# Patient Record
Sex: Female | Born: 1950 | Race: White | Hispanic: No | Marital: Married | Smoking: Never smoker
Health system: Southern US, Community
[De-identification: ages and names within clinical notes are randomized; demographics above are authoritative.]

## PROBLEM LIST (undated history)

## (undated) DIAGNOSIS — E785 Hyperlipidemia, unspecified: Secondary | ICD-10-CM

## (undated) DIAGNOSIS — R519 Headache, unspecified: Secondary | ICD-10-CM

## (undated) DIAGNOSIS — R51 Headache: Secondary | ICD-10-CM

## (undated) DIAGNOSIS — I2510 Atherosclerotic heart disease of native coronary artery without angina pectoris: Secondary | ICD-10-CM

## (undated) DIAGNOSIS — G8929 Other chronic pain: Secondary | ICD-10-CM

## (undated) DIAGNOSIS — R001 Bradycardia, unspecified: Secondary | ICD-10-CM

## (undated) DIAGNOSIS — I10 Essential (primary) hypertension: Secondary | ICD-10-CM

## (undated) HISTORY — DX: Other chronic pain: G89.29

## (undated) HISTORY — PX: CORONARY STENT PLACEMENT: SHX1402

## (undated) HISTORY — DX: Headache: R51

## (undated) HISTORY — PX: OTHER SURGICAL HISTORY: SHX169

## (undated) HISTORY — DX: Headache, unspecified: R51.9

## (undated) HISTORY — DX: Bradycardia, unspecified: R00.1

## (undated) HISTORY — DX: Atherosclerotic heart disease of native coronary artery without angina pectoris: I25.10

## (undated) HISTORY — PX: CARDIAC CATHETERIZATION: SHX172

## (undated) HISTORY — DX: Hyperlipidemia, unspecified: E78.5

## (undated) HISTORY — DX: Essential (primary) hypertension: I10

---

## 1998-06-15 ENCOUNTER — Other Ambulatory Visit: Admission: RE | Admit: 1998-06-15 | Discharge: 1998-06-15 | Payer: Self-pay | Admitting: *Deleted

## 1999-06-28 ENCOUNTER — Encounter: Payer: Self-pay | Admitting: *Deleted

## 1999-06-28 ENCOUNTER — Other Ambulatory Visit: Admission: RE | Admit: 1999-06-28 | Discharge: 1999-06-28 | Payer: Self-pay | Admitting: *Deleted

## 1999-06-28 ENCOUNTER — Encounter: Admission: RE | Admit: 1999-06-28 | Discharge: 1999-06-28 | Payer: Self-pay | Admitting: *Deleted

## 2001-05-05 ENCOUNTER — Encounter: Payer: Self-pay | Admitting: *Deleted

## 2001-05-05 ENCOUNTER — Encounter: Admission: RE | Admit: 2001-05-05 | Discharge: 2001-05-05 | Payer: Self-pay | Admitting: *Deleted

## 2001-05-05 ENCOUNTER — Other Ambulatory Visit: Admission: RE | Admit: 2001-05-05 | Discharge: 2001-05-05 | Payer: Self-pay | Admitting: *Deleted

## 2002-06-21 ENCOUNTER — Encounter: Payer: Self-pay | Admitting: *Deleted

## 2002-06-21 ENCOUNTER — Encounter: Admission: RE | Admit: 2002-06-21 | Discharge: 2002-06-21 | Payer: Self-pay | Admitting: *Deleted

## 2002-07-20 ENCOUNTER — Other Ambulatory Visit: Admission: RE | Admit: 2002-07-20 | Discharge: 2002-07-20 | Payer: Self-pay | Admitting: *Deleted

## 2003-07-05 ENCOUNTER — Encounter: Admission: RE | Admit: 2003-07-05 | Discharge: 2003-07-05 | Payer: Self-pay | Admitting: *Deleted

## 2003-07-12 ENCOUNTER — Other Ambulatory Visit: Admission: RE | Admit: 2003-07-12 | Discharge: 2003-07-12 | Payer: Self-pay | Admitting: *Deleted

## 2003-12-01 ENCOUNTER — Other Ambulatory Visit: Admission: RE | Admit: 2003-12-01 | Discharge: 2003-12-01 | Payer: Self-pay | Admitting: *Deleted

## 2004-07-11 ENCOUNTER — Other Ambulatory Visit: Admission: RE | Admit: 2004-07-11 | Discharge: 2004-07-11 | Payer: Self-pay | Admitting: *Deleted

## 2004-07-19 ENCOUNTER — Encounter: Admission: RE | Admit: 2004-07-19 | Discharge: 2004-07-19 | Payer: Self-pay | Admitting: *Deleted

## 2005-09-17 ENCOUNTER — Encounter: Admission: RE | Admit: 2005-09-17 | Discharge: 2005-09-17 | Payer: Self-pay | Admitting: *Deleted

## 2005-09-25 ENCOUNTER — Other Ambulatory Visit: Admission: RE | Admit: 2005-09-25 | Discharge: 2005-09-25 | Payer: Self-pay | Admitting: *Deleted

## 2005-12-26 ENCOUNTER — Encounter: Admission: RE | Admit: 2005-12-26 | Discharge: 2005-12-26 | Payer: Self-pay | Admitting: Unknown Physician Specialty

## 2006-09-23 ENCOUNTER — Encounter: Admission: RE | Admit: 2006-09-23 | Discharge: 2006-09-23 | Payer: Self-pay | Admitting: *Deleted

## 2006-10-07 ENCOUNTER — Other Ambulatory Visit: Admission: RE | Admit: 2006-10-07 | Discharge: 2006-10-07 | Payer: Self-pay | Admitting: *Deleted

## 2007-04-15 ENCOUNTER — Encounter: Payer: Self-pay | Admitting: Cardiology

## 2007-04-30 ENCOUNTER — Ambulatory Visit: Payer: Self-pay | Admitting: Cardiology

## 2007-05-01 ENCOUNTER — Encounter: Payer: Self-pay | Admitting: Cardiology

## 2007-05-05 ENCOUNTER — Inpatient Hospital Stay (HOSPITAL_BASED_OUTPATIENT_CLINIC_OR_DEPARTMENT_OTHER): Admission: RE | Admit: 2007-05-05 | Discharge: 2007-05-05 | Payer: Self-pay | Admitting: Cardiovascular Disease

## 2007-05-05 ENCOUNTER — Ambulatory Visit: Payer: Self-pay | Admitting: Cardiovascular Disease

## 2007-05-05 ENCOUNTER — Inpatient Hospital Stay (HOSPITAL_COMMUNITY): Admission: RE | Admit: 2007-05-05 | Discharge: 2007-05-06 | Payer: Self-pay | Admitting: Cardiology

## 2007-05-21 ENCOUNTER — Ambulatory Visit: Payer: Self-pay | Admitting: Cardiology

## 2007-08-10 ENCOUNTER — Ambulatory Visit: Payer: Self-pay | Admitting: Cardiology

## 2007-09-21 ENCOUNTER — Inpatient Hospital Stay (HOSPITAL_COMMUNITY): Admission: RE | Admit: 2007-09-21 | Discharge: 2007-09-25 | Payer: Self-pay | Admitting: Orthopedic Surgery

## 2008-01-12 ENCOUNTER — Encounter: Admission: RE | Admit: 2008-01-12 | Discharge: 2008-01-12 | Payer: Self-pay | Admitting: Family Medicine

## 2008-07-22 ENCOUNTER — Encounter: Payer: Self-pay | Admitting: Cardiology

## 2008-08-01 ENCOUNTER — Ambulatory Visit: Payer: Self-pay | Admitting: Cardiology

## 2008-08-01 ENCOUNTER — Encounter: Payer: Self-pay | Admitting: Physician Assistant

## 2009-02-02 ENCOUNTER — Encounter: Admission: RE | Admit: 2009-02-02 | Discharge: 2009-02-02 | Payer: Self-pay | Admitting: Family Medicine

## 2009-02-02 IMAGING — CR DG CHEST 1V PORT
1 series · 1 of 1 positions shown · non-contrast
Comparison: None

CLINICAL DATA: Postop knee replacement. Temperature.

CHEST - 1 VIEW

[view not recorded]
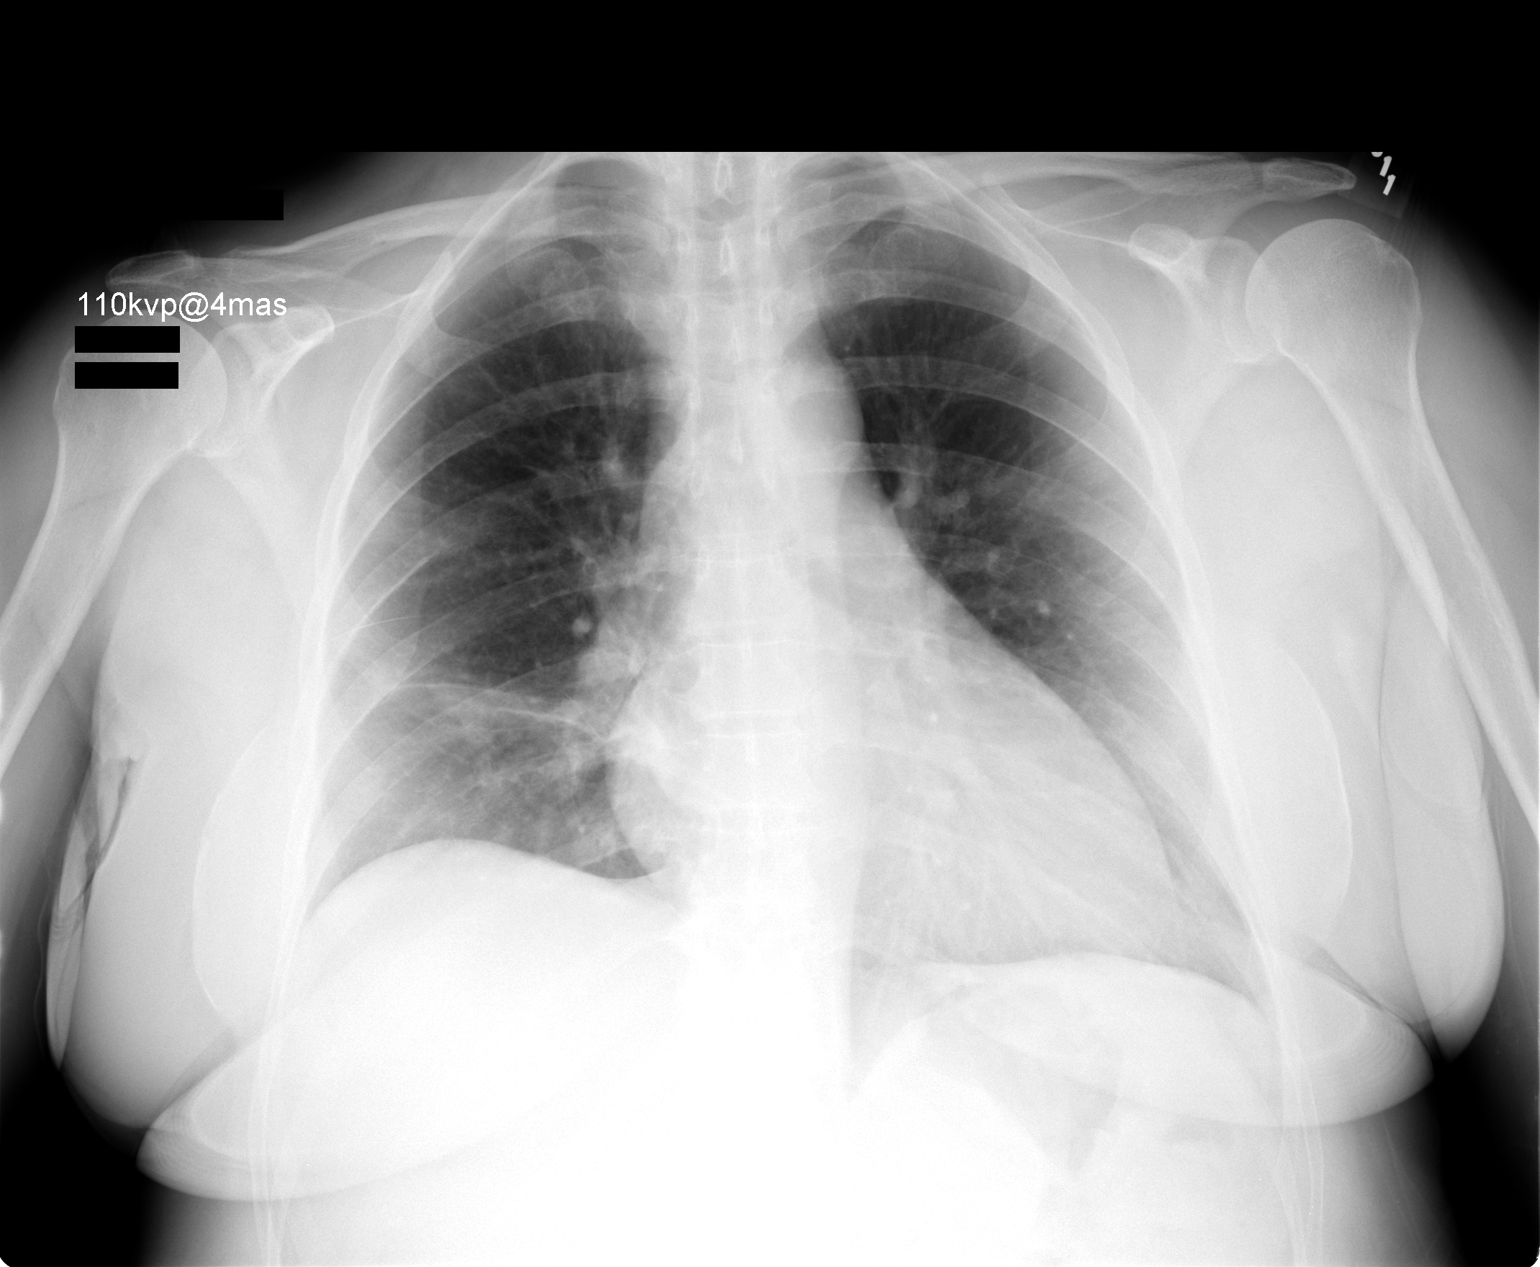

[1 of 1 positions shown; findings below may reference images not displayed]

FINDINGS: Midline trachea. Normal heart size for level of inspiration. No
pleural effusion or pneumothorax. Mild right hemidiaphragm elevation. Minimal
linear opacity in the right lower lobe. Left lung clear.

IMPRESSION

1. Volume loss and linear opacity right lung base. Most likely atelectasis. If
symptoms persist, followup films should be considered to exclude early
pneumonia, felt less likely.

## 2009-10-02 ENCOUNTER — Encounter: Payer: Self-pay | Admitting: Cardiology

## 2009-10-06 ENCOUNTER — Encounter: Payer: Self-pay | Admitting: Cardiology

## 2009-10-24 DIAGNOSIS — E785 Hyperlipidemia, unspecified: Secondary | ICD-10-CM

## 2009-10-24 DIAGNOSIS — E7800 Pure hypercholesterolemia, unspecified: Secondary | ICD-10-CM

## 2009-10-24 DIAGNOSIS — R5381 Other malaise: Secondary | ICD-10-CM

## 2009-10-24 DIAGNOSIS — R072 Precordial pain: Secondary | ICD-10-CM | POA: Insufficient documentation

## 2009-10-24 DIAGNOSIS — R5383 Other fatigue: Secondary | ICD-10-CM

## 2009-10-25 ENCOUNTER — Ambulatory Visit: Payer: Self-pay | Admitting: Cardiology

## 2009-10-25 DIAGNOSIS — I2510 Atherosclerotic heart disease of native coronary artery without angina pectoris: Secondary | ICD-10-CM | POA: Insufficient documentation

## 2009-10-25 DIAGNOSIS — I259 Chronic ischemic heart disease, unspecified: Secondary | ICD-10-CM | POA: Insufficient documentation

## 2009-11-14 ENCOUNTER — Encounter: Payer: Self-pay | Admitting: Cardiology

## 2010-02-21 ENCOUNTER — Encounter: Admission: RE | Admit: 2010-02-21 | Discharge: 2010-02-21 | Payer: Self-pay | Admitting: Family Medicine

## 2010-02-28 ENCOUNTER — Encounter: Payer: Self-pay | Admitting: Cardiology

## 2010-03-05 ENCOUNTER — Encounter: Payer: Self-pay | Admitting: Cardiology

## 2010-09-10 ENCOUNTER — Encounter: Payer: Self-pay | Admitting: Cardiology

## 2010-09-13 NOTE — Letter (Signed)
Summary: External Correspondence/ OFFICE NOTE DR. DANIEL  External Correspondence/ OFFICE NOTE DR. DANIEL   Imported By: Dorise Hiss 03/09/2010 11:01:06  _____________________________________________________________________  External Attachment:    Type:   Image     Comment:   External Document

## 2010-09-13 NOTE — Assessment & Plan Note (Signed)
Summary: 1 YR FU PER DEC REMINDER-SRS   Visit Type:  Follow-up Primary Provider:  Dr. Donzetta Sprung  CC:  follow-up visit.  History of Present Illness: the patient is a [age]-year-old female with history of single vessel coronary artery disease status post bare-metal stenting in 2008. The patient has normal LV function. She had a followup pharmacological stress test in December of 2008 for preoperative clearance. She status post bilateral knee replacement.  The patient reports no angina pectoris, shortness of breath orthopnea PND. She reports no palpitations or syncope. Reportedly, the patient had lipid panel done, but she was unable to provide me with the results. She will try to get them from her husband and forward them to me  Clinical Review Panels:  Cardiac Imaging Cardiac Cath Findings  CARDIAC CATHETERIZATION      ANGIOGRAPHIC DATA:  Prior to the intervention, the LAD demonstrates a   high-grade stenosis that is short, focal, and discrete, just prior to   the second LAD diagonal bifurcation.  Following deployment of a short   non-drug-eluting platform, the stenosis was 80%-90%, and following   dilatation, this was reduced to 0% residual luminal narrowing.  There   was no evidence of edge tear. There was slight pinching of the tiny   first diagonal, but this was not significant.  Flow into the second   diagonal and bifurcation was excellent.      CONCLUSION:  Successful percutaneous stenting of the left anterior   descending artery, as described above.      DISPOSITION:  The patient be treated with aspirin and Plavix.  She will   follow up with Dr. Andee Lineman.               Arturo Morton. Riley Kill, MD, [facility]   Electronically Signed     (05/05/2007)    Preventive Screening-Counseling & Management  Alcohol-Tobacco     Smoking Status: never  Current Medications (verified): 1)  Aspirin 81 Mg Tbec (Aspirin) .... Take One Tablet By Mouth Daily 2)  Simvastatin 40 Mg Tabs  (Simvastatin) .... Take One Tablet By Mouth Daily At Bedtime 3)  Pantoprazole Sodium 40 Mg Tbec (Pantoprazole Sodium) .... Take 1 Tablet By Mouth Once A Day 4)  Minocycline Hcl 100 Mg Tabs (Minocycline Hcl) .... Take 1 Tablet By Mouth Once A Day 5)  Trazodone Hcl 100 Mg Tabs (Trazodone Hcl) .... Take 1 Tablet By Mouth Once A Day 6)  Atenolol 50 Mg Tabs (Atenolol) .... Take One Tablet By Mouth Daily  Allergies (verified): No Known Drug Allergies  Comments:  Nurse/Medical Assistant: The patient's medications were reviewed with the patient and were updated in the Medication List. Pt brought medication bottles to office visit.  Cyril Loosen, RN, BSN (October 25, 2009 9:08 AM)  Past History:  Past Medical History: Reviewed history from 10/24/2009 and no changes required. 1. Single vessel CAD.     a.     Status post bare metal stenting of LAD, September 2008.     b.     Normal LVF.     c.     Normal adenosine Cardiolite; EF 60%, December 2008. 2. Asymptomatic sinus bradycardia. 3. Borderline hypertension. 4. Dyslipidemia. 5. Chronic headaches.   Past Surgical History: cardiac catherization Arthroscopy, abrasion arthroplasty of medical remoral condyle   1994 Status post bilateral knee replacements  Family History: Reviewed history and no changes required. noncontributory  Social History: Reviewed history from 10/24/2009 and no changes required. Married  Tobacco Use - No.  Smoking Status:  never  Review of Systems  The patient denies fatigue, malaise, fever, weight gain/loss, vision loss, decreased hearing, hoarseness, chest pain, palpitations, shortness of breath, prolonged cough, wheezing, sleep apnea, coughing up blood, abdominal pain, blood in stool, nausea, vomiting, diarrhea, heartburn, incontinence, blood in urine, muscle weakness, joint pain, leg swelling, rash, skin lesions, headache, fainting, dizziness, depression, anxiety, enlarged lymph nodes, easy bruising or  bleeding, and environmental allergies.    Vital Signs:  Patient profile:   60 year old female Height:      60 inches Weight:      145.50 pounds BMI:     28.52 Pulse rate:   52 / minute BP sitting:   114 / 77  (left arm) Cuff size:   regular  Vitals Entered By: Cyril Loosen, RN, BSN (October 25, 2009 9:04 AM)  Nutrition Counseling: Patient's BMI is greater than 25 and therefore counseled on weight management options. CC: follow-up visit Comments Follow up visit. No cardiac complaints   Physical Exam  Additional Exam:  General: Well-developed, well-nourished in no distress head: Normocephalic and atraumatic eyes PERRLA/EOMI intact, conjunctiva and lids normal nose: No deformity or lesions mouth normal dentition, normal posterior pharynx neck: Supple, no JVD.  No masses, thyromegaly or abnormal cervical nodes lungs: Normal breath sounds bilaterally without wheezing.  Normal percussion heart: regular rate and rhythm with normal S1 and S2, no S3 or S4.  PMI is normal.  No pathological murmurs abdomen: Normal bowel sounds, abdomen is soft and nontender without masses, organomegaly or hernias noted.  No hepatosplenomegaly musculoskeletal: Back normal, normal gait muscle strength and tone normal pulsus: Pulse is normal in all 4 extremities Extremities: No peripheral pitting edema neurologic: Alert and oriented x 3 skin: Intact without lesions or rashes cervical nodes: No significant adenopathy psychologic: Normal affect    Impression & Recommendations:  Problem # 1:  CORONARY ARTERY DISEASE, S/P PTCA (ICD-414.9) the patient reports no recurrent chest pain. We will continue her current medical regimen. No indication for stress testing. The following medications were removed from the medication list:    Isoptin Sr 120 Mg Cr-tabs (Verapamil hcl) .Marland Kitchen... 1 tablet by mouth once a day Her updated medication list for this problem includes:    Aspirin 81 Mg Tbec (Aspirin) .Marland Kitchen... Take one  tablet by mouth daily    Atenolol 50 Mg Tabs (Atenolol) .Marland Kitchen... Take one tablet by mouth daily  Problem # 2:  DYSLIPIDEMIA (ICD-272.4) reportedly the patient's LDL is not within goal. She'll forward the results to me. Consideration should be given to increase simvastatin to maintain a goal LDL of less than 100 mg percent. Her updated medication list for this problem includes:    Simvastatin 40 Mg Tabs (Simvastatin) .Marland Kitchen... Take one tablet by mouth daily at bedtime  Other Orders: EKG w/ Interpretation (93000)  Patient Instructions: 1)  Your physician recommends that you continue on your current medications as directed. Please refer to the Current Medication list given to you today. 2)  Follow up in  1 year

## 2010-09-13 NOTE — Cardiovascular Report (Signed)
Summary: Cardiac Cath Other  Cardiac Cath Other   Imported By: Zachary George 10/24/2009 19:07:39  _____________________________________________________________________  External Attachment:    Type:   Image     Comment:   External Document

## 2010-09-13 NOTE — Miscellaneous (Signed)
Summary: rx - simvastatin  Clinical Lists Changes  Medications: Changed medication from SIMVASTATIN 40 MG TABS (SIMVASTATIN) Take one tablet by mouth daily at bedtime to SIMVASTATIN 80 MG TABS (SIMVASTATIN) Take 1 tab by mouth at bedtime - Signed Rx of SIMVASTATIN 80 MG TABS (SIMVASTATIN) Take 1 tab by mouth at bedtime;  #30 x 6;  Signed;  Entered by: Hoover Brunette, LPN;  Authorized by: Lewayne Bunting, MD, [facility];  Method used: Faxed to [facility], [address], [place], [place], [place]  [address], Ph: [phone], Fax: [phone]    Prescriptions: SIMVASTATIN 80 MG TABS (SIMVASTATIN) Take 1 tab by mouth at bedtime  #30 x 6   Entered by:   Hoover Brunette, LPN   Authorized by:   Lewayne Bunting, MD, [facility]   Signed by:   Hoover Brunette, LPN on 78/46/9629   Method used:   Faxed to ...       [facility] (retail)       [address]       [place], [place]  [address]       Ph: [phone]       Fax: [phone]   RxID:   [id number]

## 2010-09-13 NOTE — Letter (Signed)
Summary: External Correspondence/ OFFICE NOTE [facility]  External Correspondence/ OFFICE NOTE [facility]   Imported By: Dorise Hiss 10/16/2009 15:53:48  _____________________________________________________________________  External Attachment:    Type:   Image     Comment:   External Document

## 2010-12-05 ENCOUNTER — Ambulatory Visit: Payer: Self-pay | Admitting: Cardiology

## 2010-12-25 NOTE — Assessment & Plan Note (Signed)
[facility]                          [facility] OFFICE NOTE   DORAINE, SCHEXNIDER                   MRN:          [id number]  DATE:05/21/2007                            DOB:          1951/04/24    HISTORY OF PRESENT ILLNESS:  The patient is a 60 year old female, wife  of Dr. Marry Guan here in [place].  The patient had recent symptoms of  substernal chest pain and was referred for catheterization, and was  found to have a focal LAD lesion which was stented with a non-drug-  eluting Platform.  The procedure was performed by Dr. Riley Kill.  The  patient has done quite well since.  She reports no groin complications.  She has no recurrent chest pain or shortness of breath.  She continues  to have significant disability because of her bilateral knee problems.   MEDICATIONS:  1. Glucosamine.  2. Minocycline.  3. Atenolol 50 mg p.o. daily.  4. Tylenol P.M.  5. Calcium.  6. Folic acid.  7. B12.  8. Coenzyme-Q10.  9. Vitamin C.  10.Fish oil.  11.Multivitamin.  12.Plavix 75 mg p.o. daily.   PHYSICAL EXAMINATION:  VITAL SIGNS:  Blood pressure 123/76, heart rate  60, weight is 142 pounds.  NECK:  Normal carotid upstrokes.  No carotid bruits seen.  LUNGS:  Clear breath sounds bilaterally.  HEART:  Regular rate and rhythm.  Normal S1 S2.  No murmurs, rubs, or  gallops.  ABDOMEN:  Soft nontender.  No rebound or guarding.  Good bowel sounds.  EXTREMITIES:  No cyanosis, clubbing, or edema.  NEUROLOGIC:  The patient is alert, oriented, and grossly nonfocal.   PROBLEM LIST:  1. Status post stent placement to the left anterior descending artery,      single-vessel coronary artery disease.  2. Normal left ventricular function.  3. History of hypertension, stable.  4. Family history of coronary artery disease.  5. Bilateral knee arthropathy.   PLAN:  1. The patient will have LFTs and lipids done in 6-8 weeks.  2. She will start Zocor 20 mg p.o.  q.h.s.  3. I cleared the patient for steroid injection.  4. I also told the patient that she needs to alternate Celebrex with      Tylenol after Plavix has been discontinued.  5. The Plavix can be discontinued in one month.  6. We will plan on an adenosine Cardiolite study in 3 months and clear      the patient for knee surgery.     Learta Codding, MD,FACC  Electronically Signed    GED/MedQ  DD: 05/21/2007  DT: 05/21/2007  Job #: [id number]   cc:   Almedia Balls. [name], M.D.

## 2010-12-25 NOTE — Cardiovascular Report (Signed)
NAMEBRUCE, MAYERS            ACCOUNT NO.:  [id number]   MEDICAL RECORD NO.:  [id number]          PATIENT TYPE:  INP   LOCATION:  6524                         FACILITY:  [facility]   PHYSICIAN:  Arturo Morton. Riley Kill, MD, FACCDATE OF BIRTH:  Sep 14, 1950   DATE OF PROCEDURE:  DATE OF DISCHARGE:                            CARDIAC CATHETERIZATION   INDICATIONS:  Ms. Fettes is a 59 year old female who presents with some  with exertional chest tightness.  She has been moderately active, and  this has clearly been brought on by activity.  She underwent diagnostic  catheterization by Dr. Eden Emms.  This demonstrated a high-grade stenosis  just after the small first diagonal, and prior to the large second  diagonal.  It is a fairly short discrete lesion.  Unfortunately, the  patient has required nonsteroidal anti-inflammatory drugs, and also is  in need of knee surgery.  Based on these various discussions, we  discussed the options of a drug-eluting versus non-drug-eluting  platform, and all their risks and benefits.  This was thoroughly  discussed with the patient and her husband, who is a physician, prior to  the procedure.  The patient received 600 mg of oral clopidogrel in the  day catheterization area and was brought to the catheterization  laboratory for percutaneous intervention.   PROCEDURE:  Percutaneous stenting of the left anterior descending artery  using a non-drug-eluting platform.   DESCRIPTION OF THE PROCEDURE:  The patient was brought to the  catheterization lab and prepped and draped in usual fashion.  Using a  double-glove technique, we exchanged the existing 4-French sheath for a  6-French sheath.  Subcutaneous Xylocaine was administered.  Following  this, the patient was given bivalirudin according to protocol, and ACT  was checked and found to be appropriate for percutaneous intervention.  A JL-3.5 guiding catheter was utilized and found to be appropriate for  this vessel.   A Prowater wire was then passed down the artery but only  would pass into the diagonal.  We then tried a traverse wire to try to  get into the LAD proper, and this also did not do well into the LAD.  I  therefore fashioned another Prowater wire with a steeper tip and was  able to pass it distally into the LAD.  Predilatation was then done with  a 2.25 x 9 Maverick balloon.  This was followed by deployment of a 2.75  x 12 non-drug-eluting platform Abbott Vision stent.  The stent was  carefully placed and then deployed at 14 atmospheres.  Postdilatation  was then carried out with a 3.25 short Quantum Maverick balloon  throughout the stent.  Only the very distal edges of the stent distally  were not aggressively postdilated, in large part because the distal end  of the stent was at the bifurcation.  Overall, the patient tolerated  procedure well.  All catheters were subsequently removed.  The femoral  sheath was sewn into place, and she was taken to the holding area in  satisfactory clinical condition.  I then reviewed the angiographic  studies with her husband.   ANGIOGRAPHIC DATA:  Prior  to the intervention, the LAD demonstrates a  high-grade stenosis that is short, focal, and discrete, just prior to  the second LAD diagonal bifurcation.  Following deployment of a short  non-drug-eluting platform, the stenosis was 80%-90%, and following  dilatation, this was reduced to 0% residual luminal narrowing.  There  was no evidence of edge tear. There was slight pinching of the tiny  first diagonal, but this was not significant.  Flow into the second  diagonal and bifurcation was excellent.   CONCLUSION:  Successful percutaneous stenting of the left anterior  descending artery, as described above.   DISPOSITION:  The patient be treated with aspirin and Plavix.  She will  follow up with Dr. Andee Lineman.      Arturo Morton. Riley Kill, MD, [facility]  Electronically Signed     TDS/MEDQ  D:  05/05/2007  T:   05/06/2007  Job:  [id number]   cc:   Raechel Ache Eden Emms, MD, [facility]  Ollen Gross, M.D.  Learta Codding, MD,FACC

## 2010-12-25 NOTE — Cardiovascular Report (Signed)
NAMECOLLENE, MASSIMINO            ACCOUNT NO.:  [id number]   MEDICAL RECORD NO.:  [id number]          PATIENT TYPE:  OIB   LOCATION:  NA                           FACILITY:  [facility]   PHYSICIAN:  Peter C. Eden Emms, MD, FACCDATE OF BIRTH:  October 01, 1950   DATE OF PROCEDURE:  DATE OF DISCHARGE:                            CARDIAC CATHETERIZATION   PROCEDURE:  Coronary arteriography.   INDICATIONS:  60 year old patient with new onset chest pain.   Cine catheterization was done, 4-French catheters from right femoral  artery.   Left main coronary artery was normal.   The mid-LAD had a 95% hazy lesion just before the takeoff of a large  second diagonal branch.  The distal LAD was somewhat slow to fill.  Circumflex coronary artery was nondominant and normal.   The right coronary artery was dominant and normal.   RAO VENTRICULOGRAPHY:  RAO ventriculography was normal.  EF was 60%.  There was no gradient across the aortic valve and no MR.  LV pressure  was 143/7, aortic pressure was 140/80.   IMPRESSION:  The films will be reviewed with Dr. Riley Kill.  We will  transfer the patient to the inpatient lab.  She will subsequently have  angioplasty and stenting of the mid-LAD today.   The patient does eventually need bilateral knee surgery.  She also has a  trip planned to [place] in 2-3 weeks.  I will leave it up to Dr. Riley Kill  and the patient to decide whether to place a drug-eluting or non-drug-  eluting stent.  The LAD is probably a 3-5 vessel and could probably  accommodate either one.  She is a nondiabetic.      Noralyn Pick. Eden Emms, MD, [facility]  Electronically Signed     PCN/MEDQ  D:  05/05/2007  T:  05/05/2007  Job:  [id number]   cc:   Learta Codding, MD,FACC

## 2010-12-25 NOTE — Assessment & Plan Note (Signed)
[facility]                          [facility] OFFICE NOTE   REDITH, DRACH                   MRN:          [id number]  DATE:04/30/2007                            DOB:          06-04-1951    PRIMARY CARE PHYSICIAN:  Dr. [name].   REASON FOR CONSULTATION:  Self referral for observation of new onset  substernal chest pain.   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 Mora Appl [place].  I  received a call yesterday from Dr. Mora Appl to evaluate his wife because  of new onset substernal chest pain.  The patient reports to me that she  had new onset substernal chest pain, which was described as a heaviness  for the last 3 weeks.  There is associated diaphoresis and sweatiness,  as well as radiation to both arms with a sensation of heaviness and  numbness in both arms.  The patient exercises an hour daily on a  recumbent stationary bike.  She has noticed onset of substernal chest  pain on the bike initially.  The patient states that episodes last a few  minutes, and then resolves when she discontinues her stationary bike.  The patient denies, however, any associated shortness of breath.  She  feels these symptoms have somewhat increased in frequency.  She also had  an episode a couple of days ago where she developed substernal chest  pain at rest, and this was very brief.  The patient then tried to get on  her exercise bike, and fairly quickly her symptoms then worsened.  She  then stopped the exercise bike and felt better.  Interestingly, the  patient's symptoms occur mainly in the morning, but this is the time  when she does her exercises.   In the office today, the patient reports no chest pain at rest, and her  EKG is essentially within normal limits.   ALLERGIES:  NO KNOWN DRUG ALLERGIES.   MEDICATIONS:  The patient, unfortunately, does not know the doses of  her  medications, but she will call back to [place] with dosing.  Glucosamine  chondroitin, minocycline, atenolol daily, voltaren, Tylenol PM, calcium,  folic acid, B12, coenzyme Q10, vitamin C, fish oil, multivitamin.   PAST MEDICAL HISTORY:  1. History of hypertension, on atenolol.  2. History of tonsillectomy and tubal ligation.  3. Arthroscopic knee surgery on chronic nonsteroidal treatment with      voltaren.   FAMILY HISTORY:  Father died of a heart attack at age 42.  He also had  several brothers that the patient recalls had heart disease.  Mother  died in a car accident.  She has 4 brothers, 1 who had a heart attack,  and 1 who has angina __________ .  She is the 2nd oldest sister.   SOCIAL HISTORY:  As outlined above, the patient is a former Licensed conveyancer.  She also runs and exercises regularly, now on the recumbent  bike due to her knee problems.  Patient rarely has an alcoholic  beverage.  She does  not smoke.  Patient is the wife of Dr. Mora Appl here  in [place].   REVIEW OF SYSTEMS:  As per HPI.  No nausea or vomiting.  No fever or  chills.  No melena or hematochezia.  No dysuria or frequency.  No  orthopnea or PND.  No palpitations or syncope.  No neurological  symptoms.   PHYSICAL EXAMINATION:  VITAL SIGNS:  Blood pressure 121/80.  Heart rate  is 58 beats per minute.  Weight is 139 pounds.  GENERAL:  Well-nourished white female in no apparent distress.  HEENT:  Normal.  NECK EXAM:  Normal carotid upstroke.  No carotid bruits.  LUNGS:  Clear breath sounds bilaterally.  HEART:  Regular rate and rhythm with normal S1 and S2.  No murmurs,  rubs, or gallops.  ABDOMEN:  Soft and nontender.  No rebound.  Good bowel sounds.  EXTREMITY EXAM:  No cyanosis, clubbing, or edema.  NEUROLOGIC:  Patient is alert, oriented, and grossly nonfocal.   PROBLEM LIST:  1. Substernal chest pain.  Rule out angina.  2. Family history of coronary artery disease.  3. Bilateral knee arthropathy  scheduled for bilateral knee      replacement.  4. History of hypertension.   PLAN:  1. The patient's symptoms are certainly concerning for angina.  I      suspect the patient has greater than 50% pretest probability of      coronary artery disease.  2. I have given the patient various options as how to best proceed      with ruling out underlying heart disease.  Given her high pretest      probability, I do not think nuclear testing and exercise treadmill      test are indicated.  I also gave her the option of cardiac CT,      although, I also do not think that this is a good option given her      high pretest probability, and performing a cardiac CT may lead to      layer testing.  3. I have discussed with the patient risks and benefits of a      diagnostic catheterization, and she has agreed to proceed.  I also      talked to her husband on the phone, and felt comfortable with my      decision process.  I asked the patient to stop her voltaren in the      meanwhile, and to take aspirin on a daily basis.  We also gave her      a prescription for p.r.n. nitroglycerin.  I told both the patient      and her husband that if she has recurrent pain at rest, she will      need to present to the emergency room immediately, and administer      nitroglycerin.  4. The patient has been scheduled at the [facility] with      Dr. Eden Emms (whom I called personally) at 10:30 next Tuesday.     Learta Codding, MD,FACC  Electronically Signed    GED/MedQ  DD: 04/30/2007  DT: 04/30/2007  Job #: [id number]   cc:   Claud Kelp R. [name], M.D.

## 2010-12-25 NOTE — Op Note (Signed)
NAMECAYLEEN, BENJAMIN            ACCOUNT NO.:  [id number]   MEDICAL RECORD NO.:  [id number]          PATIENT TYPE:  INP   LOCATION:  0005                         FACILITY:  [facility]   PHYSICIAN:  Ollen Gross, M.D.    DATE OF BIRTH:  [birth year]   DATE OF PROCEDURE:  09/21/2007  DATE OF DISCHARGE:                               OPERATIVE REPORT   PREOPERATIVE DIAGNOSIS:  Osteoarthritis, bilateral knees.   POSTOPERATIVE DIAGNOSIS:  Osteoarthritis, bilateral knees.   PROCEDURE:  Bilateral total knee arthroplasty.   SURGEON:  Ollen Gross, M.D.   ASSISTANT:  Alexzandrew L. Perkins, P.A.C.   ANESTHESIA:  Combined spinal and epidural.   ESTIMATED BLOOD LOSS:  Minimal.   DRAINS:  Autovac x1 each side.   TOURNIQUET TIME:  Right 42 minutes at 300 mmHg, left 41 minutes at 300  mmHg.   COMPLICATIONS:  None.   CONDITION:  Good to recovery.   CLINICAL NOTE:  Ms. Blubaugh is a 60 year old female with end-stage severe  arthritis, both knees, with progressively worsening pain and  dysfunction.  She has failed nonoperative management and presents now  for bilateral total knee arthroplasty.   PROCEDURE IN DETAIL:  After successful initiation of combined spinal and  epidural anesthetic, tourniquets were placed high on both thighs and  both lower extremities were prepped and draped in the usual sterile  fashion.  She stated the right was slightly more symptomatic than the  left, so we proceeded with the right side first.  The extremity was  wrapped in an Esmarch, knee flexed, and tourniquet inflated to 300 mmHg.  A midline incision was made with 10 blade through subcutaneous tissue to  the level of the extensor mechanism.  Fresh blade was used to make a  medial parapatellar arthrotomy.  Soft tissue over the proximal and  medial tibia subperiosteally elevated to the joint line with the knife  and into the semimembranosus first with a Cobb elevator.  Soft tissue  laterally is elevated with  attention being paid to avoid the patellar  tendon and tibial tubercle.  The patella was subluxed laterally, knee  flexed 90 degrees, and ACL and PCL removed.  Drill was used to create a  starting hole in the distal femur and the canal was thoroughly  irrigated.  A 5 degrees right valgus alignment guide was placed and  referencing off the posterior condyles rotations marked and the block  pinned to remove 11 mm of the distal femur.  I took 11 mm because of  preop  flexion contracture.  Sizing blocks placed, size 2.5 is most  appropriate.  Rotation is marked off the epicondylar axis.  Size 2.5  cutting block placed and the anterior, posterior and chamfer cuts were  made.   Tibia subluxed forward and the menisci were removed.  Extramedullary  tibial alignment guide is placed referencing proximally at the medial  aspect of the tibial tubercle and distally along the second metatarsal  axis and tibial crest.  Blocks pinned to remove 2 mm off the more  deficient medial side.  Tibial resection is made with an oscillating  saw.  Size 2 was the most appropriate tibial component.  Proximal tibia  was prepared with the modular drill and keel punch for a size 2.  Femoral preparation is completed with the intercondylar cut for the size  2.5.   Size 2 mobile bearing tibial trial, size 2.5 posterior stabilized  femoral trial, and a 10 mm posterior stabilized rotating platform insert  trial are placed with.  With the 10 it hyperextended a little and a  little bit of varus/valgus placed on it with the 12.5 which allowed for  full extension with excellent varus and valgus, anterior and posterior  balance throughout full range of motion.  The patella was then everted  and thickness measured to be 21 mm.  Freehand resection taken to 12 mm,  35 template is placed, lug holes were drilled, trial patella was placed  and it tracks normally.  Osteophytes were removed off the posterior  femur with the trial in  place.  All trials are removed and the cut bone  surfaces are prepared with pulsatile lavage.  Cement was mixed and once  ready for implantation a size 2 mobile bearing tibial tray, size 2.5  posterior stabilized femur and 35 patella are cemented into place.  The  patella was held with a clamp.  Trial 12.5 mm inserts placed, knee held  in full extension, all extruded cement removed.  Once the cement had  fully hardened, then the permanent 12.5 mm posterior stabilized rotating  platform insert is placed into the tibial tray.  The wound was copiously  irrigated with saline solution and the extensor mechanism closed over a  Hemovac drain with interrupted #1 PDS.  Flexion against gravity is 135  degrees.  Tourniquet is released with a total time of 42 minutes.  Subcu  is then closed with interrupted 2-0 Vicryl.  Moist sponge is placed and  then we addressed the left knee.   Left lower extremity was wrapped in Esmarch, knee flexed, tourniquet  inflated to 300 mmHg.  Midline incision is made with a medial  parapatellar arthrotomy.  Soft tissue releases are the same as they were  on the right.  Patella was everted, knee flexed 90 degrees, ACL and PCL  removed.  Drill was used to create a starting hole in the distal femur  and the canal was thoroughly irrigated.  Five degrees left valgus  alignment guides were placed, referencing off the posterior condyles,  rotations marked and the block pinned to remove 11 mm from the distal  femur.  Once again we took 11 because of a preop flexion contracture.  Sizing blocks placed, size 2.5 is the most appropriate.  Rotations  marked off the epicondylar axis.  Size 2.5 cutting block was placed and  the anterior, posterior and chamfer cuts made.   Tibia subluxed forward and menisci were removed.  The extramedullary  tibial alignment guides placed referencing proximally at the medial  aspect of tibial tubercle and distally along the second metatarsal axis   and tibial crest.  Blocks were pinned to remove 2 mm off the more  deficient medial side.  This side has a bigger defect than the other  side, so we had to cut down a tiny bit lower.  Tibial resection is made  with the oscillating saw.  Size 2 again is the most appropriate tibial  component and the proximal tibia was prepared to modular drill and keel  punch for a size 2.  Femoral preparation is completed with the  intercondylar cut for a size 2.5.   Size 2 mobile bearing tibial trial, a size 2.5 posterior stabilized  femoral trial, and a 12.5 mm posterior stabilized rotating platform  insert trial placed.  Even with the 12.5, there was a tiny bit of varus  and valgus play and anterior and posterior play.  We went to a 15 which  allowed for full extension with excellent varus and valgus, anterior and  posterior balance throughout full range of motion.  The patella was then  everted and thickness measured to be 21 mm.  Via the freehand technique  we utilized a 35 mm trial and the patella tracked normally.  The trials  then were removed and the cut bone surfaces are prepared with pulsatile  lavage.  Prior to removing the femoral trial, I had removed the  osteophytes posteriorly from the femur.  The cement was mixed and once  ready for implantation, the size 2 mobile- bearing tibial trial, size  2.5 posterior stabilized femoral trial, and 35 patella are cemented into  place.  The patella was held with the clamp.  Trial 15 mm insert was  placed and the knee held in full extension and all extruded cement  removed.  When the cement had fully hardened, then the permanent 15 mm  posterior stabilized rotating platform insert is placed into the tibial  tray.  The wound was copiously irrigated with saline solution and the  extensor mechanism closed over Hemovac drain with interrupted #1 PDS.  Subcu closed with interrupted 2-0 Vicryl and then subcuticular, and both  knees closed with running 4-0  Monocryl.  The Autovac drain had already  been hooked to suction on the right and was subsequently hooked to  suction on the left.  The incisions were cleaned and dried and Steri-  Strips and bulky sterile dressing applied.  She was placed into knee  immobilizers, awakened and transported to recovery in stable condition.      Ollen Gross, M.D.  Electronically Signed     FA/MEDQ  D:  09/21/2007  T:  09/22/2007  Job:  [id number]

## 2010-12-25 NOTE — Discharge Summary (Signed)
NAMEAZA, [name]            ACCOUNT NO.:  [id number]   MEDICAL RECORD NO.:  [id number]          Cooper TYPE:  INP   LOCATION:  6524                         FACILITY:  [facility]   PHYSICIAN:  Arturo Morton. Riley Kill, MD, FACCDATE OF BIRTH:  Aug 13, 1950   DATE OF ADMISSION:  05/05/2007  DATE OF DISCHARGE:  05/06/2007                               DISCHARGE SUMMARY   PRIMARY CARDIOLOGIST:  Dr. Andee Lineman.   PRIMARY CARE PHYSICIAN:  Almedia Balls. [name], M.D.   PROCEDURES PERFORMED:  1. Cardiac catheterization per Dr. Bonnee Quin.      a.     Left main coronary artery normal. The mid-lad has 95% hazy       lesion just before the take-off of a large second diagonal branch.       Distal LAD was somewhat slow to fill.  Circumflex coronary artery       was nondominant and normal.  Right coronary artery was dominant       and normal.  2. Status post VISION non-drug-eluting stent to the LAD per Dr.      Riley Kill, reducing 90% lesion to 0% lesion using a 2.75 x 12 VISION      and 3.25 QM post dilatation.  3. Hypertension.  4. Arthroscopic knee surgery on chronic nonsteroidal treatment with      voltaren which has now been discontinued.   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 Francee Cooper in [place] who was seen by Dr. Andee Lineman in  his office on April 30, 2007, for complaints of ongoing chest  discomfort, concerned for angina.  The Cooper spoke with Dr. Andee Lineman  thoroughly and given her high precath probability for CAD the Cooper  was referred for cardiac catheterization at [facility].  The  Cooper was seen and examined by Dr. Bonnee Quin and cardiac  catheterization was completed on May 06, 2007.  Please see Dr.  Rosalyn Charters thorough cardiac catheterization note for full details.  It  was found that the Cooper had a 95% hazy lesion just before the take-  off of a large second diagonal branch in the mid-LAD.  The Cooper  subsequently  had VISION non-drug-eluting stent placed to the mid-LAD  reducing 90% to 0%.  The Cooper was kept overnight, placed on Plavix  and recovered very well.  The Cooper did not have any arrhythmias,  bleeding, hematoma, bruit from catheterization site.  She had no further  chest discomfort.  The Cooper was seen and examined the following  morning by myself and Dr. Bonnee Quin.  The Cooper's EKG revealed sinus  bradycardia with a ventricular rate of 59 beats/min. Otherwise normal.  The Cooper had no complaints.  The right groin was evaluated again and  was found to be healthy.  The Cooper was placed on Plavix 75 mg p.o.  daily and was to continue aspirin, beta blocker, statin will be  determined per Dr. Andee Lineman on follow-up visit.   Vital signs on discharge, blood pressure 127/80, heart rate 56,  respirations 19, O2 saturation 98% on room air.  Discharge labs:  Hemoglobin 12.4, hematocrit 36.2, white blood cells  6.1, platelets 249.  Sodium 135, potassium 3.7, chloride 102, CO2 27,  BUN 17, creatinine 0.75, glucose 87.   DISCHARGE MEDICATIONS:  1. Plavix 75 mg daily (new prescription provided).  2. Aspirin 325 mg p.o. daily.  3. Nitroglycerin 0.4 mg p.r.n. sublingually chest pain.  4. Femhrt p.o. daily.  5. Atenolol 50 mg daily.  6. Glucosamine chondroitin daily.  7. Minocycline daily.  8. Calcium, folic acid, B12, Q10, vitamin C, fish oil, multivitamin      daily.   ALLERGIES:  THE Cooper HAS NO KNOWN DRUG ALLERGIES AND NO CONTRAST  MEDIA ALLERGIES.   FOLLOW-UP APPOINTMENT:  1. The Cooper will be seen by Dr. Andee Lineman on May 21, 2007, at 2:45      p.m. for continued cardiac management and discussion of need for      statin.  2. The Cooper will follow up with   DICTATION ENDS HERE.      [name]. Lyman Bishop, NP      Arturo Morton. Riley Kill, MD, [facility]  Electronically Signed    KML/MEDQ  D:  05/06/2007  T:  05/06/2007  Job:  [id number]

## 2010-12-25 NOTE — Discharge Summary (Signed)
[name], LUDWIG            ACCOUNT NO.:  [id number]   MEDICAL RECORD NO.:  [id number]          PATIENT TYPE:  INP   LOCATION:  6524                         FACILITY:  [facility]   PHYSICIAN:  Arturo Morton. Riley Kill, MD, FACCDATE OF BIRTH:  09-20-1950   DATE OF ADMISSION:  05/05/2007  DATE OF DISCHARGE:  05/06/2007                               DISCHARGE SUMMARY   ADDENDUM:  1. The patient has followup appointment with Dr. Lewayne Bunting on May 21, 2007 at 2:45 p.m.  2. The patient will follow up with her primary care physician for      continued medical management.  3. The patient is given post cardiac catheterization instructions with      particular emphasis on right groin site for evidence of bleeding,      hematoma or infection.  4. The patient has been advised to discontinue taking Voltaren.  5. The patient has been a prescription for Plavix 75 mg daily.  6. Discretion for need for addition of statin per doctor against      discretion.   Time spent with the patient to include physician time 45 minutes.      Arturo Morton. Riley Kill, MD, [facility]  Electronically Signed     TDS/MEDQ  D:  05/06/2007  T:  05/06/2007  Job:  [id number]

## 2010-12-25 NOTE — Assessment & Plan Note (Signed)
[facility]                          [facility] OFFICE NOTE   [name], [name]                   MRN:          [id number]  DATE:08/01/2008                            DOB:          Dec 30, 1950    PRIMARY CARDIOLOGIST:  Learta Codding, MD, [facility]   REASON FOR VISIT:  Annual followup.   [name] returns to our clinic for continued monitoring and management  of single-vessel coronary artery disease.  She underwent treatment of a  high-grade LAD lesion with bare metal stenting, by Dr. Riley Kill, in  September 2008.  She has normal left ventricular function.  A followup  pharmacologic stress test in December 2008, for preoperative clearance,  showed normal perfusion; EF 60%, and with normal wall motion.  Clinically, reports no recurrent symptoms of exertional angina pectoris,  which preceded her treatment.  She also has since undergone bilateral  total knee replacement, by Dr. Ollen Gross, in February 2009.   More recently, [name] had up titration of her atenolol from 50-75 mg  daily, for ongoing management of chronic headaches.  She reports that  this has lead to some palpable improvement.  She denies any development  of fatigue, sluggishness, or dizziness.   Her simvastatin was also recently up titrated as well, from 20-40 mg  daily, in light of very recent lipid data notable for an LDL of 105.   [name] has never smoked tobacco.   EKG in our office today indicates marked sinus bradycardia at 47 bpm  with normal axis and nonspecific ST changes.   CURRENT MEDICATIONS:  1. Full-dose aspirin.  2. Simvastatin 20 mg nightly.  3. Pantoprazole 40 daily.  4. Atenolol 75 daily.  5. Minocycline 100 mg daily.  6. Trazodone 100 mg nightly.   PHYSICAL EXAMINATION:  VITAL SIGNS:  Blood pressure 146/92, pulse 49,  regular, weight 139.  GENERAL:  A 60 year old female, sitting upright, no distress.  HEENT:  Normocephalic, atraumatic.  NECK:   Palpable, carotid pulse without bruits; no JVD.  LUNGS:  Clear to auscultation in all fields.  HEART:  Regular rate and rhythm.  No significant murmurs.  No rubs.  ABDOMEN:  Soft, nontender.  EXTREMITIES:  Intact pulses without edema.  NEURO:  No focal deficit.   IMPRESSION:  1. Single vessel CAD.      a.     Status post bare metal stenting of LAD, September 2008.      b.     Normal LVF.      c.     Normal adenosine Cardiolite; EF 60%, December 2008.  2. Asymptomatic sinus bradycardia.  3. Borderline hypertension.  4. Dyslipidemia.  5. Chronic headaches.   PLAN:  1. Down titrate aspirin initially to 162 mg daily, then 81 mg      indefinitely.  2. Agree with recent up titration of simvastatin to 40 mg daily, for      aggressive lipid management with LDL target of 70 or less, if      possible.  Recommend surveillance fasting lipid/liver profile in 12      weeks.  3. Following  review by Dr. Andee Lineman, recommendation is as follows:  So      as to treat both her migraine headaches as well as borderline      hypertension, our plan is to substitute atenolol with verapamil 120      mg daily.  We will first taper down the atenolol to 25 mg daily x1      week, then stop altogether.  In the meanwhile, the patient will be      started on Isoptin SR 120 mg daily.  The latter was also chosen for      its less pronounced effect on bradycardia.      Gene Serpe, PA-C  Electronically Signed      Learta Codding, MD,FACC  Electronically Signed   GS/MedQ  DD: 08/01/2008  DT: 08/02/2008  Job #: [id number]   cc:   Donzetta Sprung

## 2010-12-25 NOTE — H&P (Signed)
[name], [name]            ACCOUNT NO.:  [id number]   MEDICAL RECORD NO.:  [id number]          PATIENT TYPE:  INP   LOCATION:  0005                         [facility]   PHYSICIAN:  Ollen Gross, M.D.    DATE OF BIRTH:  04/14/1951   DATE OF ADMISSION:  09/21/2007  DATE OF DISCHARGE:                              HISTORY & PHYSICAL   Date of office visit, history and physical performed on September 03, 2007.   CHIEF COMPLAINT:  Bilateral knee pain.   HISTORY OF PRESENT ILLNESS:  The patient is a 60 year old female who has  seen by Dr. Lequita Halt for ongoing bilateral knee pain.  She is the wife of  Dr. Mora Appl in [place] and has been evaluated by Dr. Lequita Halt.  The knee  has been bothering her for about 10 or 15 years now.  It has been  progressive in nature.  She has known end-stage arthritis and  dysfunction of both knees.  She felt she has reached the point where she  would like to have something done about it.  The risks and benefits of  the procedure have been discussed.  She elects to proceed with surgery.  She has been seen preoperatively and felt to be stable to proceed with  surgery   ALLERGIES:  NO KNOWN DRUG ALLERGIES.   CURRENT MEDICATIONS:  1. Atenolol.  2. Simvastatin.  3. Aspirin.  4. Protonix.  5. Lunesta.  6. Tylenol PM.  7. Multiple vitamins.  8. Voltaren.   PAST MEDICAL HISTORY:  1. Hypertension.  2. Coronary arterial disease with history of angina.  3. Arthritis.   PAST SURGICAL HISTORY:  Cardiac catheterization in September of 2008  with placement of coronary stent for a lesion in the LAD which the  patient said was about 95% and also tubal ligation.   FAMILY HISTORY:  Father deceased secondary to MI at age 48.  She has two  siblings, two brothers actually with angina.   SOCIAL HISTORY:  Married, homemaker, nonsmoker.  One to two drinks of  alcohol per week.  Three children.  Husband will be assisting with care  after surgery.   REVIEW  OF SYSTEMS:  GENERAL:  No fevers, chills, night sweats.  NEURO:  No seizures, syncope or paralysis.  RESPIRATORY:  No shortness breath,  productive cough or hemoptysis.  CARDIOVASCULAR:  No chest pain, angina  or orthopnea.  GI:  No nausea, vomiting, diarrhea or constipation.  GU:  No dysuria or discharge.  MUSCULOSKELETAL:  Joint pain and morning  stiffness with both knees.   PHYSICAL EXAMINATION:  VITAL SIGNS:  Pulse 54, respirations 12, blood  pressure 120/78.  GENERAL:  A 60 year old white female well-nourished, well-developed, no  acute distress, short stature, alert, oriented and cooperative,  pleasant, excellent historian.  HEENT:  Normocephalic, atraumatic.  Pupils are round and reactive.  Oropharynx clear.  EOMs intact.  NECK:  Supple.  CHEST:  Clear.  HEART:  Regular rate and rhythm, except somewhat of a bradycardic rhythm  with pulse 54.  S1 and S2.  No murmurs.  ABDOMEN: Soft, slightly round.  Bowel sounds  present.  RECTAL/BREASTS/GENITALIA:  Not done as not pertinent to present illness.  EXTREMITIES:  Right knee no effusion, marked crepitus noted.  Tender  more lateral than medial.  No instability.  Left knee no effusion,  marked crepitus, tender more lateral than medial, no instability.   IMPRESSION:  1. Bilateral knees osteoarthritis.  2. Hypertension.  3. Coronary arterial disease.  4. History of angina.   PLAN:  The patient will be admitted to [facility] to undergo  bilateral total knee replacement arthroplasty.  Surgery will be  performed by Ollen Gross.      Alexzandrew L. Perkins, P.A.C.      Ollen Gross, M.D.  Electronically Signed    ALP/MEDQ  D:  09/20/2007  T:  09/21/2007  Job:  [id number]   cc:   Almedia Balls. [name], M.D.  Fax: [phone]   Learta Codding, MD,FACC  [address]  [place], [place] [address]   Ollen Gross, M.D.  Fax: [phone]

## 2010-12-28 NOTE — Discharge Summary (Signed)
[name], [name]            ACCOUNT NO.:  [id number]   MEDICAL RECORD NO.:  [id number]          PATIENT TYPE:  INP   LOCATION:  1608                         FACILITY:  [facility]   PHYSICIAN:  Ollen Gross, M.D.    DATE OF BIRTH:  [birth year]   DATE OF ADMISSION:  09/21/2007  DATE OF DISCHARGE:  09/25/2007                               DISCHARGE SUMMARY   ADMITTING DIAGNOSIS:  1. Osteoarthritis bilateral knees.  2. Hypertension.  3. Coronary artery disease.  4. History of angina.   DISCHARGE DIAGNOSIS:  1. Osteoarthritis bilateral knees, bilateral total knee replacement      arthroplasties.  2. Acute blood loss anemia.  3. Status post transfusion without sequelae.  4. Postop hyponatremia improved.  5. Hypertension.  6. Coronary artery disease.  7. History of angina.   PROCEDURE:  September 21, 2007 bilateral total knees.  Surgeon Dr.  Lequita Halt, assistant Avel Peace, PA-C.  Anesthesia spinal epidural.  Tourniquet time 42 minutes on the right,  41 minutes on the left.   CONSULTS:  None.   BRIEF HISTORY:  [name] is a 60 year old female with end-stage  arthritis of both knees, progressive worsening pain and dysfunction who  has failed nonoperative management and now presents for bilateral total  knee replacement arthroplasties.   LABORATORY DATA:  Preop CBC, hemoglobin of 14.0,  hematocrit of 40.3,  white cell count 4.8, red cell count 4.37, platelets 242.  Serial H&Hs  were followed.  Hemoglobin dropped down to 9.5 and then to 8.2. Given  two units of blood, post transfusion hemoglobin back up to 9.4, last  noted H&H 9.5 and  26.3.  PT and PTT preop 12.7 and 27 respectively.  INR 0.9.  Serial pro times followed.  Last noted PT and INR 188 and  1.5. Chem panel on admission, mildly elevated AST of 58, elevated ALT of  73, elevated ALP of 153, slightly elevated BUN of 26.  The remaining  BMET is within normal limits.  Sodium dropped from  141 to 132, got as  low as 124  back up to 136, BUN came down to a normal level of 15,  potassium remained within normal limits. Preop UA, small hemoglobin,  small leukocyte esterase, 0-2 white cells, 0-2 red cells.  Follow-up UA  moderate hemoglobin, trace ketones, positive proteins, many epithelials.  There was only 0-2 white cells, 7-10 red cells, many bacteria were used.  UA, E-coli, Enterococcus species. The urine culture was pending at the  time of discharge   HOSPITAL COURSE:  The patient was admitted to [facility],  taken to the OR and underwent the above stated procedure without  complication. The patient  tolerated the procedure well and was later  transferred to the recovery room and the orthopedic floor. She was given  a spinal on epidural postop for pain control.  She was transferred to  the orthopedic floor for postoperative  care,  monitored very closely,  had a little bit of pain through the night and blood pressure was low.  The epidural rate had to be reduced and that was adjusted by anesthesia.  The right Hemovac drain was pulled on the morning of day one, the left  had come out in the middle of the night so both were out by the morning  of day one. She was diuresing fluids pretty well despite the low  pressures. High blood pressure history but the pressures were low so we  held her meds as needed.  She was on a beta-blocker for heart disease.  We reduced the dose secondary to her low pressures. Anesthesia adjusted  her epidural as needed due to her symptoms and also her pressures. By  the morning of day two, she was doing well.  She had been started on  p.o. pain medications.  The epidural had come out. On postop day #1, the  hemoglobin was around 9 and stable but by the morning of day 2,  hemoglobin was a little bit further lower at 8.2.  The epidural was  pulled and we gave her a couple units of blood, both dressings were  changed, the incision looked good.  She slowly started getting up  out of  bed.  The pain increased a little bit with the epidural out but then  encouraged p.o. meds and PCA for pain control. By the morning of day  three, she was up walking and she had ambulated about 40 feet the day  before in the evening. She had a little bit of episode of heartburn  which was relieved by p.o. medications. Recommended a GI cocktail if it  did return. Sodium was down due to all the fluids so we had to put her  on some mild fluid restriction on the morning of day three and rechecked  her BMET. Her sodium did come back up with the fluid restrictions. Her  blood count was improved up to 9.4 after 2 units of blood, kept her on  the iron. She continued to slowly progress well with physical therapy  walking about 50 feet by day three. By day four, she was doing well, her  temperature had been up a little bit the evening before, so on the  morning of day 4, she had a chest x-ray  and UA. The chest x-ray looked  okay. The UA was borderline. The culture was pending. Once the chest x-  ray was okay and her temperature had improved, we decided to discharge  her home later that day.   DISCHARGE/PLAN:  1. The patient discharged home on September 25, 2007.  2. Discharge diagnoses please see above.  3. Discharge medications:  OxyIR, Robaxin, Nu-Iron. Lovenox and      Coumadin.  Follow-up 2 weeks.   ACTIVITY:  Weightbearing as tolerated both legs. Home health PT and home  health nursing, total knee protocol.   DISPOSITION:  Home.   CONDITION ON DISCHARGE:  Improved.      Alexzandrew L. Perkins, P.A.C.      Ollen Gross, M.D.  Electronically Signed    ALP/MEDQ  D:  11/05/2007  T:  11/05/2007  Job:  [id number]   cc:   Almedia Balls. [name], M.D.  Fax: [phone]   Learta Codding, MD,FACC  [address]  [place], [place] [address]

## 2010-12-28 NOTE — H&P (Signed)
[name], [name]            ACCOUNT NO.:  [id number]   MEDICAL RECORD NO.:  [id number]          Cooper TYPE:  INP   LOCATION:  6524                         FACILITY:  [facility]   PHYSICIAN:  Tara Pick. Eden Emms, MD, FACCDATE OF BIRTH:  1951-01-28   DATE OF ADMISSION:  05/05/2007  DATE OF DISCHARGE:                              HISTORY & PHYSICAL   PRIMARY CARDIOLOGIST:  [name].   PRIMARY CARE PHYSICIAN:  [name].   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  The Cooper reported to [name]  that the pain was described as heaviness for the last 3 weeks.  It was  associated with diaphoresis and sweatiness as well as radiation to both  arms and the sensation of heaviness and numbness in both arms.  The  Cooper exercises an hour daily on recumbent stationary bike and noticed  the onset of substernal chest pain on the bike initially.  The Cooper  stated that the episodes lasted a few minutes and then resolve when she  discontinues her stationary bike.  The Cooper denied any shortness of  breath associated, however.  The Cooper also states that the symptoms  have increased in frequency somewhat, and she had an episode a couple of  days prior to being seen by [name] in his office in [place].  The  Cooper states that the symptoms mostly occur in the morning at the time  that she does her exercises.  The Cooper denied no pain at rest, and  her EKG was found to be normal.   Had a long discussion with [name], and he suspected that the Cooper  had a greater than 50% pretest probability of coronary artery disease.  He gave the Cooper various options on how to proceed ruling out  underlying heart disease.  Given her high pretest probability, he felt  that nuclear testing and exercise treadmill were not indicated.  The  Cooper also  was given the option of a cardiac CT, although he did not  think it was a good option given her high pretest probability.  Therefore, the Cooper elected to have a cardiac catheterization after  risks and benefits of cardiac catheterization were discussed with the  Cooper and her husband.  The Cooper was scheduled as an outpatient to  the [facility] for a cardiac catheterization with Dr. Eden Cooper.   PAST MEDICAL HISTORY:  1. Hypertension.  2. A history of tonsillectomy.  3. Arthroscopic knee surgery, on chronic nonsteroidal treatment with      Voltaren.   FAMILY HISTORY:  Father had a heart attack at age 90 causing his death.  He also had several brothers that the Cooper recalls who have heart  disease.  Mother had died in a car accident.  She had 4 brothers, 1 of  whom had a heart attack and 1 who has unstable angina.   SOCIAL HISTORY:  She is a former Tree surgeon.  She also runs and  exercises regularly on a recumbent bike secondary to knee problems.  She  rarely drinks alcohol.  She does not smoke.  She is the wife of Dr.  Mora Cooper in [place].   ADMISSION LABS:  Hemoglobin 12.4, hematocrit 36.2, white blood cells  6.1, platelets 249.  Sodium 135, potassium 3.7, chloride 102, CO2 of 27,  glucose 87, BUN 17, creatinine 0.76.   PHYSICAL EXAMINATION:  VITAL SIGNS ON ADMISSION:  Blood pressure 138/68,  pulse 51, respirations 18, temperature 98.1.  HEENT:  Head is normocephalic, atraumatic.  Eyes:  PERRLA.  Mucous  membranes in mouth pink and moist.  Tongue is midline.  NECK:  Supple.  There is no JVD, no carotid bruits appreciated.  CARDIOVASCULAR:  Regular rate and rhythm without murmurs, rubs, or  gallops.  LUNGS:  Clear to auscultation without wheezes, rales, or rhonchi.  ABDOMEN:  Soft, nontender, 2+ bowel sounds.  EXTREMITIES:  Without clubbing, cyanosis, or edema.   CURRENT MEDICATIONS AT HOME:  1. Glucosamine/chondroitin 1 p.o. every day.  2. Minocycline 1 p.o. every day.  3.  Atenolol 50 mg once a day.  4. Voltaren 1 tablet once a day.  5. Femhrt once a day.  6. Tylenol PM 2 tablets p.r.n. h.s.  7. Calcium once a day.  8. Folic acid once a day.  9. B12 once a day.  10.Co-Q10 once a day.  11.Vitamin C once a day.  12.Multivitamin and fish oil once a day.   ALLERGIES:  NO KNOWN DRUG ALLERGIES.   PLAN:  The Cooper will be admitted to [facility] for cardiac  catheterization.  Cooper will have further recommendations based upon  results of catheterization and need for intervention at [name]  discretion.      Tara Mare. Lyman Bishop, NP      Tara Pick. Eden Emms, MD, [facility]  Electronically Signed    KML/MEDQ  D:  05/28/2007  T:  05/29/2007  Job:  [id number]   cc:   [name], M.D.

## 2011-01-10 ENCOUNTER — Ambulatory Visit: Payer: Self-pay | Admitting: Cardiology

## 2011-01-16 ENCOUNTER — Ambulatory Visit: Payer: Self-pay | Admitting: Cardiology

## 2011-03-13 ENCOUNTER — Other Ambulatory Visit: Payer: Self-pay | Admitting: Family Medicine

## 2011-03-13 DIAGNOSIS — Z1231 Encounter for screening mammogram for malignant neoplasm of breast: Secondary | ICD-10-CM

## 2011-03-14 ENCOUNTER — Encounter: Payer: Self-pay | Admitting: Cardiology

## 2011-03-25 ENCOUNTER — Ambulatory Visit
Admission: RE | Admit: 2011-03-25 | Discharge: 2011-03-25 | Disposition: A | Discharge status: Home / Self Care | Payer: PRIVATE HEALTH INSURANCE | Source: Ambulatory Visit | Attending: Family Medicine | Admitting: Family Medicine

## 2011-03-25 DIAGNOSIS — Z1231 Encounter for screening mammogram for malignant neoplasm of breast: Secondary | ICD-10-CM

## 2011-04-02 ENCOUNTER — Encounter: Payer: Self-pay | Admitting: Cardiology

## 2011-04-02 ENCOUNTER — Ambulatory Visit (INDEPENDENT_AMBULATORY_CARE_PROVIDER_SITE_OTHER): Payer: PRIVATE HEALTH INSURANCE | Admitting: Cardiology

## 2011-04-02 VITALS — BP 119/81 | HR 58 | Ht 60.0 in | Wt 153.0 lb

## 2011-04-02 DIAGNOSIS — E7800 Pure hypercholesterolemia, unspecified: Secondary | ICD-10-CM

## 2011-04-02 DIAGNOSIS — I2510 Atherosclerotic heart disease of native coronary artery without angina pectoris: Secondary | ICD-10-CM

## 2011-04-02 DIAGNOSIS — I259 Chronic ischemic heart disease, unspecified: Secondary | ICD-10-CM

## 2011-04-02 NOTE — Assessment & Plan Note (Signed)
No recurrent chest pain. Patient can continue current medical therapy. No indication to repeat stress testing at this point in time. The patient also wants extensor visits and I told her that she can be seen when necessary if needed.

## 2011-04-02 NOTE — Assessment & Plan Note (Signed)
Followed by primary care physician and reportedly at goal.

## 2011-04-02 NOTE — Patient Instructions (Signed)
Continue all current medications. Follow up as needed  

## 2011-04-02 NOTE — Progress Notes (Signed)
HPI The patient is a 60 year old female wife of Dr. Karlton Lemon. She has a history of single-vessel coronary artery disease status post bare-metal stenting in 2008 with normal LV function. She is also status post bilateral knee replacements. The patient is doing well from a cardiovascular perspective. She denies any chest pain or shortness of breath. She has no palpitations or syncope. She does report some ruminating thoughts and difficulty sleeping for which she takes trazodone.  No Known Allergies  Current Outpatient Prescriptions on File Prior to Visit  Medication Sig Dispense Refill  . aspirin 81 MG EC tablet Take 81 mg by mouth daily.        Marland Kitchen atenolol (TENORMIN) 50 MG tablet Take 75 mg by mouth daily.       . minocycline (DYNACIN) 100 MG tablet Take 100 mg by mouth daily.        . pantoprazole (PROTONIX) 40 MG tablet Take 40 mg by mouth daily.        . traZODone (DESYREL) 100 MG tablet Take 100 mg by mouth daily.          Past Medical History  Diagnosis Date  . CAD (coronary artery disease)     single vessel; s/p stenting, nml LVF, nml adenosine cardiolite; EF 60% 12/08  . Sinus bradycardia     asymptomatic  . HTN (hypertension)     boarderline  . Dyslipidemia   . Chronic headache     Past Surgical History  Procedure Date  . Cardiac catheterization   . Coronary stent placement     9/08; LAD  . Arthroscopy     abrasion athroplasty of medical remoral condyle 1994  . Bilateral knee replacement     No family history on file.  History   Social History  . Marital Status: Married    Spouse Name: N/A    Number of Children: N/A  . Years of Education: N/A   Occupational History  . Not on file.   Social History Main Topics  . Smoking status: Never Smoker   . Smokeless tobacco: Never Used   Comment: tobacco use - no  . Alcohol Use: Not on file  . Drug Use: Not on file  . Sexually Active: Not on file   Other Topics Concern  . Not on file   Social History Narrative   Married.    Review of systems:Pertinent positives as outlined above. The remainder of the 18  point review of systems is negative  PHYSICAL EXAM BP 119/81  Pulse 58  Ht 5' (1.524 m)  Wt 153 lb (69.4 kg)  BMI 29.88 kg/m2 General: Well-developed, well-nourished in no distress Head: Normocephalic and atraumatic Eyes:PERRLA/EOMI intact, conjunctiva and lids normal Ears: No deformity or lesions Mouth:normal dentition, normal posterior pharynx Neck: Supple, no JVD.  No masses, thyromegaly or abnormal cervical nodes Lungs: Normal breath sounds bilaterally without wheezing.  Normal percussion Cardiac: regular rate and rhythm with normal S1 and S2, no S3 or S4.  PMI is normal.  No pathological murmurs Abdomen: Normal bowel sounds, abdomen is soft and nontender without masses, organomegaly or hernias noted.  No hepatosplenomegaly MSK: Back normal, normal gait muscle strength and tone normal Vascular: Pulse is normal in all 4 extremities Extremities: No peripheral pitting edema Neurologic: Alert and oriented x 3 Skin: Intact without lesions or rashes Lymphatics: No significant adenopathy Psychologic: Normal affect   ECG: Normal sinus rhythm no acute changes  ASSESSMENT AND PLAN

## 2011-04-05 ENCOUNTER — Ambulatory Visit: Payer: Self-pay | Admitting: Cardiology

## 2011-05-03 LAB — BASIC METABOLIC PANEL
BUN: 13
BUN: 15
BUN: 16
BUN: 4 — ABNORMAL LOW
BUN: 7
CO2: 28
CO2: 28
CO2: 29
Calcium: 8.1 — ABNORMAL LOW
Calcium: 8.1 — ABNORMAL LOW
Calcium: 8.3 — ABNORMAL LOW
Calcium: 8.4
Chloride: 101
Chloride: 89 — ABNORMAL LOW
Chloride: 97
Creatinine, Ser: 0.61
Creatinine, Ser: 0.67
Creatinine, Ser: 0.69
Creatinine, Ser: 0.79
GFR calc Af Amer: >60
GFR calc Af Amer: >60
GFR calc non Af Amer: >60
GFR calc non Af Amer: >60
GFR calc non Af Amer: >60
Glucose, Bld: 115 — ABNORMAL HIGH
Glucose, Bld: 134 — ABNORMAL HIGH
Glucose, Bld: 154 — ABNORMAL HIGH
Potassium: 3.8
Potassium: 4

## 2011-05-03 LAB — CBC
HCT: 23.2 — ABNORMAL LOW
HCT: 26.7 — ABNORMAL LOW
Hemoglobin: 14
MCHC: 35.1
MCHC: 35.3
MCHC: 35.4
MCV: 91
MCV: 92.4
MCV: 93.5
Platelets: 135 — ABNORMAL LOW
Platelets: 154
Platelets: 178
Platelets: 210
Platelets: 242
RBC: 2.86 — ABNORMAL LOW
RBC: 2.89 — ABNORMAL LOW
RDW: 12.3
RDW: 12.9
WBC: 4.8
WBC: 8.6

## 2011-05-03 LAB — URINALYSIS, ROUTINE W REFLEX MICROSCOPIC
Leukocytes, UA: NEGATIVE
Nitrite: NEGATIVE
Protein, ur: NEGATIVE
Specific Gravity, Urine: 1.023

## 2011-05-03 LAB — PROTIME-INR
INR: 0.9
INR: 1.2
INR: 1.2
INR: 1.5
Prothrombin Time: 15.1
Prothrombin Time: 15.4 — ABNORMAL HIGH
Prothrombin Time: 18.8 — ABNORMAL HIGH

## 2011-05-03 LAB — COMPREHENSIVE METABOLIC PANEL
AST: 58 — ABNORMAL HIGH
Alkaline Phosphatase: 153 — ABNORMAL HIGH
Creatinine, Ser: 0.66
GFR calc non Af Amer: >60
Glucose, Bld: 83
Sodium: 141
Total Protein: 7.4

## 2011-05-03 LAB — URINE CULTURE

## 2011-05-03 LAB — TYPE AND SCREEN

## 2011-05-03 LAB — ABO/RH: ABO/RH(D): O POS

## 2011-05-03 LAB — URINE MICROSCOPIC-ADD ON

## 2011-05-03 LAB — APTT: aPTT: 27

## 2011-05-23 LAB — BASIC METABOLIC PANEL
GFR calc Af Amer: >60
GFR calc non Af Amer: >60
Glucose, Bld: 87
Potassium: 3.7
Sodium: 135

## 2011-05-23 LAB — CBC
HCT: 36.2
Hemoglobin: 12.4
RBC: 3.86 — ABNORMAL LOW
RDW: 12.4
WBC: 6.1

## 2012-03-04 ENCOUNTER — Other Ambulatory Visit: Payer: Self-pay | Admitting: Family Medicine

## 2012-03-04 DIAGNOSIS — Z1231 Encounter for screening mammogram for malignant neoplasm of breast: Secondary | ICD-10-CM

## 2012-04-14 ENCOUNTER — Ambulatory Visit
Admission: RE | Admit: 2012-04-14 | Discharge: 2012-04-14 | Disposition: A | Discharge status: Home / Self Care | Payer: PRIVATE HEALTH INSURANCE | Source: Ambulatory Visit | Attending: Family Medicine | Admitting: Family Medicine

## 2012-04-14 DIAGNOSIS — Z1231 Encounter for screening mammogram for malignant neoplasm of breast: Secondary | ICD-10-CM

## 2013-07-01 ENCOUNTER — Other Ambulatory Visit: Payer: Self-pay

## 2013-07-01 DIAGNOSIS — Z1231 Encounter for screening mammogram for malignant neoplasm of breast: Secondary | ICD-10-CM

## 2013-08-03 ENCOUNTER — Ambulatory Visit
Admission: RE | Admit: 2013-08-03 | Discharge: 2013-08-03 | Disposition: A | Discharge status: Home / Self Care | Payer: PRIVATE HEALTH INSURANCE | Source: Ambulatory Visit

## 2013-08-03 DIAGNOSIS — Z1231 Encounter for screening mammogram for malignant neoplasm of breast: Secondary | ICD-10-CM

## 2014-12-12 IMAGING — MG MM SCREENING W/IMPLANTS
8 series · 8 of 8 positions shown · non-contrast
Comparison: Previous exam(s)

ACR Breast Density Category a: The breast tissue is almost entirely
fatty.

CLINICAL DATA: Screening. Bilateral prepectoral silicone implants
placed in 8441

EXAM:
DIGITAL SCREENING BILATERAL MAMMOGRAM WITH IMPLANTS AND CAD
The patient has prepectoral implants. Standard and implant displaced
views were performed.

[R CC]
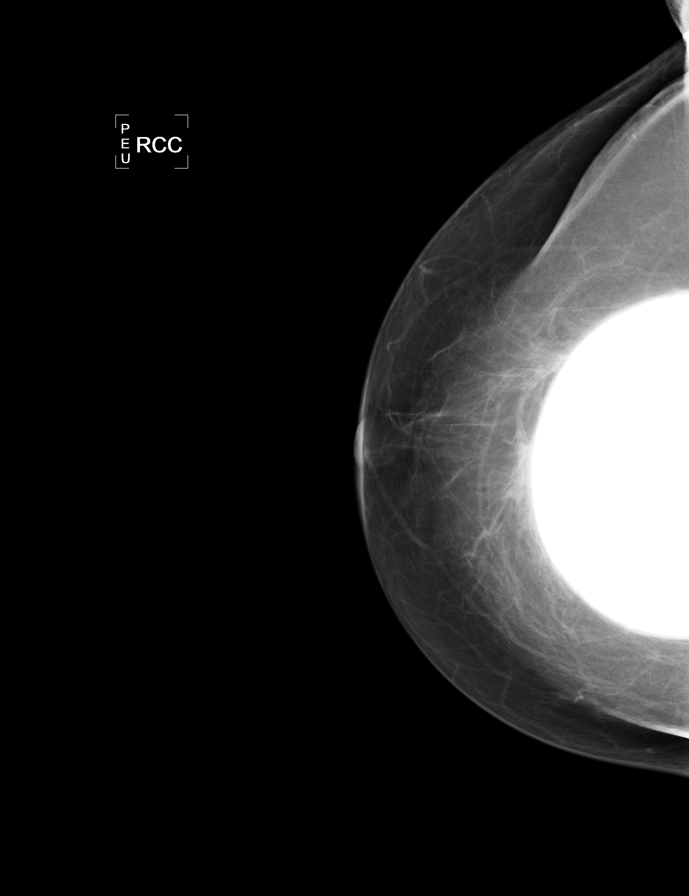

[L CC]
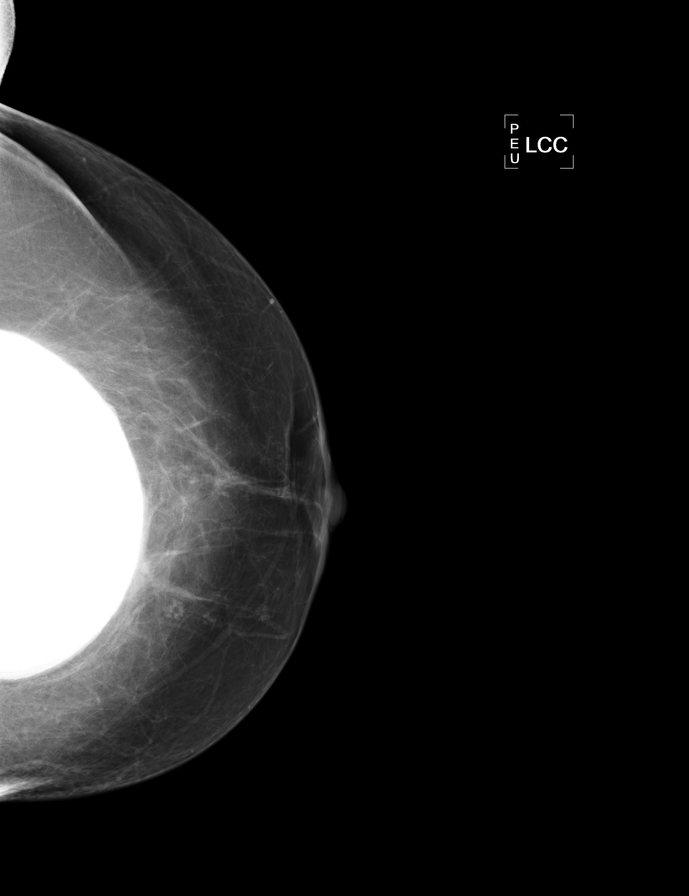

[L MLO]
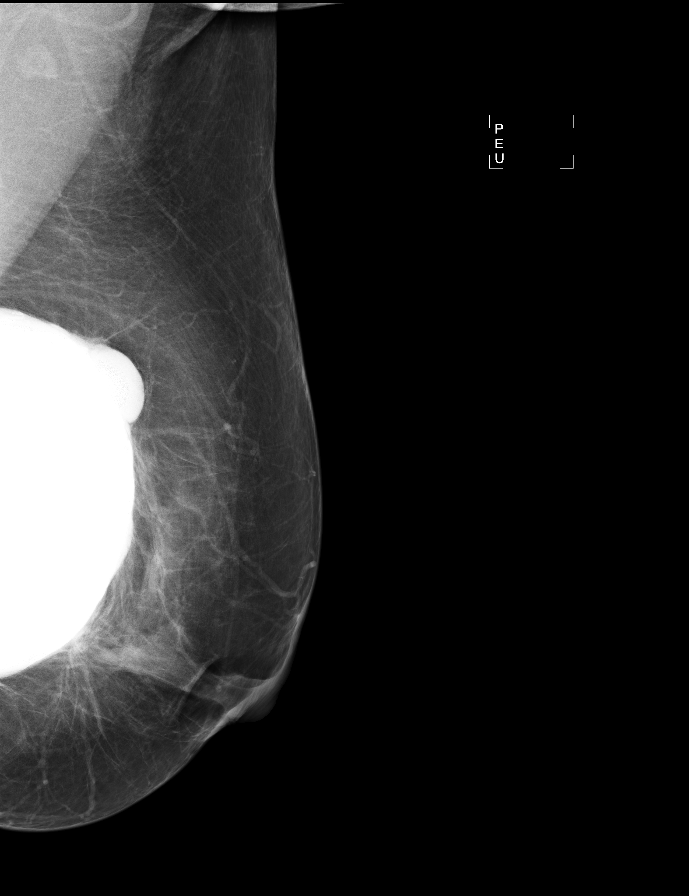

[R MLO]
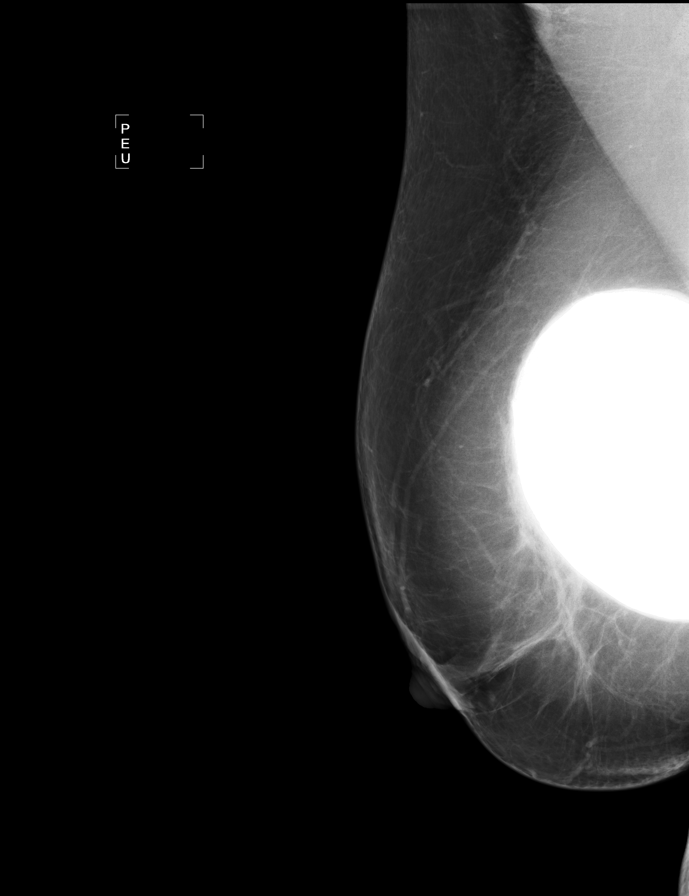

[R CCID]
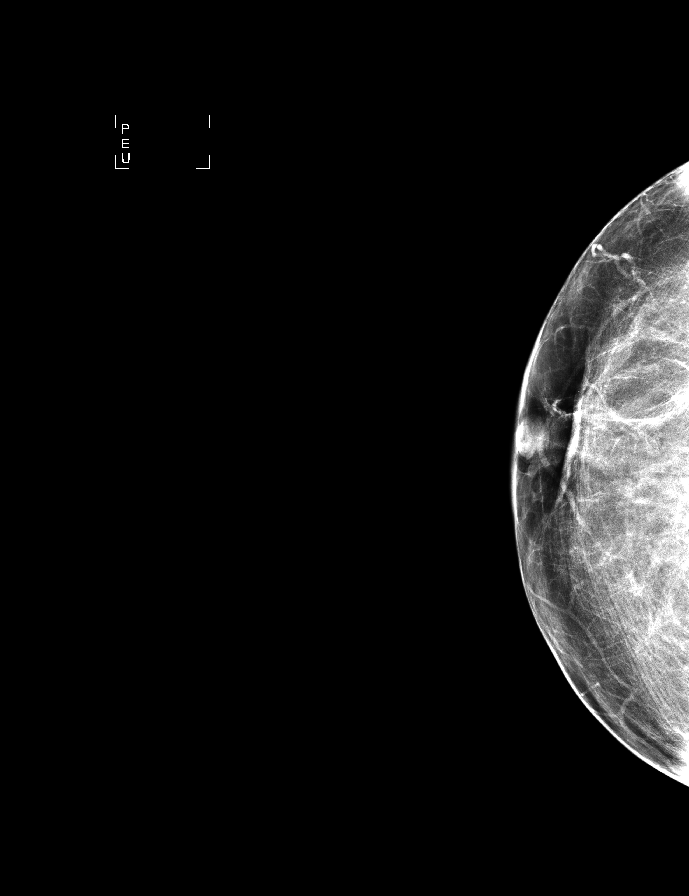

[L CCID]
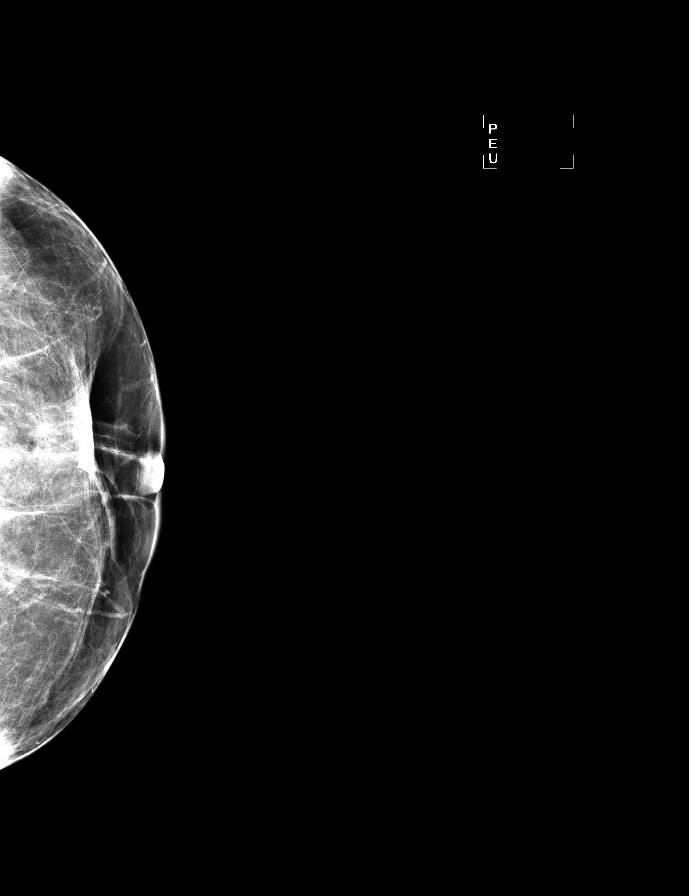

[L MLOID]
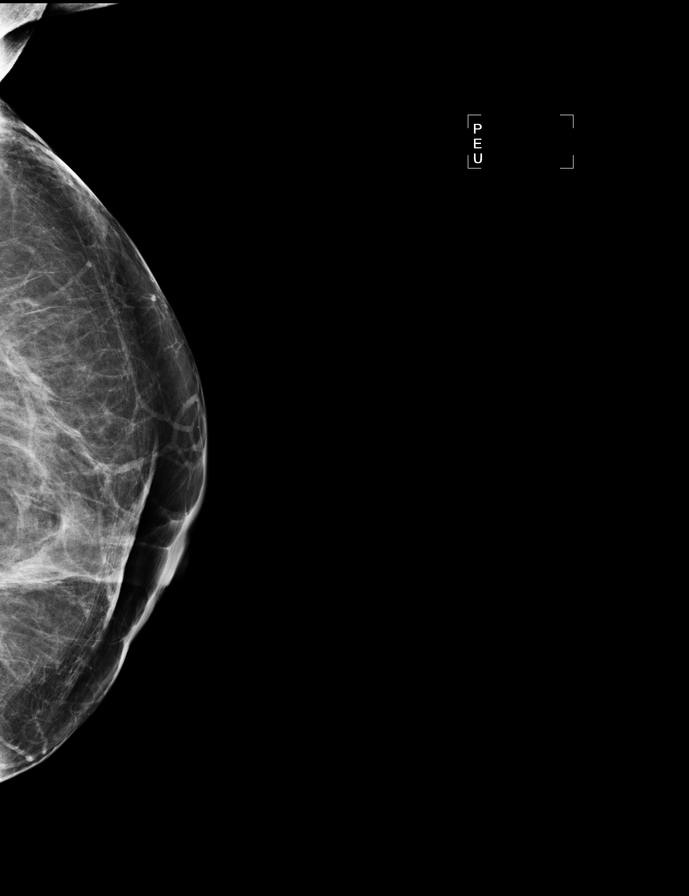

[R MLOID]
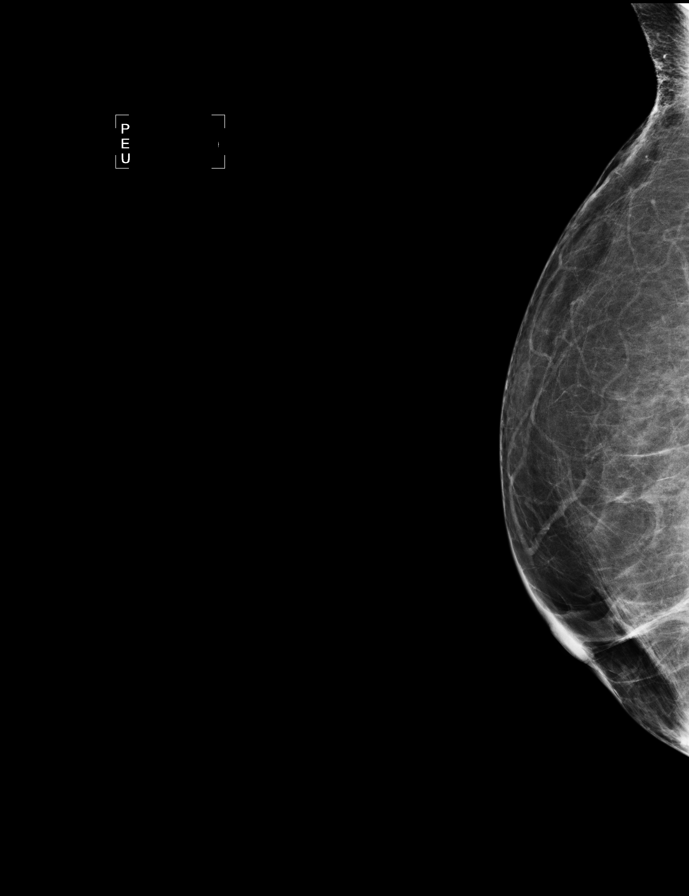

[8 of 8 positions shown; findings below may reference images not displayed]

FINDINGS: There are no findings suspicious for malignancy stable contour
deformity of the upper portion of the left breast implant is noted.
Images were processed with CAD.
IMPRESSION: No mammographic evidence of malignancy. A result letter of this
screening mammogram will be mailed directly to the patient.

RECOMMENDATION:
Screening mammogram in one year. (Code:G9-D-4FI)

BI-RADS CATEGORY  1:  Negative

## 2017-07-15 ENCOUNTER — Other Ambulatory Visit: Payer: Self-pay | Admitting: Family Medicine

## 2017-07-15 DIAGNOSIS — Z1231 Encounter for screening mammogram for malignant neoplasm of breast: Secondary | ICD-10-CM

## 2017-08-21 ENCOUNTER — Ambulatory Visit: Payer: PRIVATE HEALTH INSURANCE

## 2020-05-18 ENCOUNTER — Encounter: Payer: Self-pay | Admitting: Neurology

## 2020-07-03 NOTE — Progress Notes (Signed)
NEUROLOGY CONSULTATION NOTE  [name] MRN: [id number] DOB: October 30, 1950  Referring provider: Donzetta Sprung, MD Primary care provider: Donzetta Sprung, MD  Reason for consult:  headache   Subjective:  [name] is a 69 year old right-handed female with CAD, HTN, dyslipidemia, GERD, osteoarthritis who presents for headaches.  History supplemented by referring provider's note.  Headaches since her early 59s.  Worse since her mid-50s.  They are severe but moderate with Excedrin.  It is a throbbing mid-frontal pain.  Photophobia and phonophobia.  No nausea, vomiting, visual disturbance, speech disturbance, numbness or weakness.  Takes Excedrin and resolved within 10 minutes.  Three times a week headache returns later in day for which she takes Tylenol.  Headaches are daily over past couple of years.  No known triggers.  Beside Excedrin or Tylenol, nothing else helps relieve it.    Rescue protocol:  Excedrin, later may take Tylenol if returns. Current NSAIDS/analgesics:  Excedrin (daily), Tylenol, ASA 81mg  daily Current triptans:  Sumatriptan 100mg  (helps but rarely takes it because it makes her nauseous) Current ergotamine:  none Current anti-emetic:  none Current muscle relaxants:  none Current Antihypertensive medications:  Atenolol 75mg  daily Current Antidepressant medications:  Trazodone (for sleep) Current Anticonvulsant medications:  none Current anti-CGRP:  none Current Vitamins/Herbal/Supplements:  none Current Antihistamines/Decongestants:  none Other therapy:  none Hormone/birth control:  none  Past NSAIDS/analgesics:  Ibuprofen, naproxen Past abortive triptans:  none Past abortive ergotamine:  none Past muscle relaxants: none Past anti-emetic:  none Past antihypertensive medications:  none Past antidepressant medications: none Past anticonvulsant medications:  topiramate Past anti-CGRP:  Emgality (only initial dose but never got a prescription) Past  vitamins/Herbal/Supplements:  none Past antihistamines/decongestants:  none Other past therapies:  none  Caffeine:  No coffee.  Drinks tea.  Soda twice a week. Diet:  48 oz of water daily.  Does not skip meals. Exercise:  yes Depression:  no; Anxiety:  no Other pain:  Ankle sometimes hurts Sleep hygiene:  Well with trazodone Family history of headache:  Not as far as she knows   05/10/2020 LABS:  CBC with WBC 5.1, HGB 14.8, HCT 44.5, PLT 209; TSH 2.280; CMP with Na 141, K 4.6,  Cl 103, CO2 21, Ca 9.4, glucose 106, BUN 22, Cr 1.05, t bili 0.5, ALP 145, AST 34, ALT 36   PAST MEDICAL HISTORY: Past Medical History:  Diagnosis Date  . CAD (coronary artery disease)    single vessel; s/p stenting, nml LVF, nml adenosine cardiolite; EF 60% 12/08  . Chronic headache   . Dyslipidemia   . HTN (hypertension)    boarderline  . Sinus bradycardia    asymptomatic    PAST SURGICAL HISTORY: Past Surgical History:  Procedure Laterality Date  . arthroscopy     abrasion athroplasty of medical remoral condyle 1994  . bilateral knee replacement    . CARDIAC CATHETERIZATION    . CORONARY STENT PLACEMENT     9/08; LAD    MEDICATIONS: Current Outpatient Medications on File Prior to Visit  Medication Sig Dispense Refill  . aspirin 81 MG EC tablet Take 81 mg by mouth daily.      05/12/2020 atenolol (TENORMIN) 50 MG tablet Take 75 mg by mouth daily.     14/08 atorvastatin (LIPITOR) 40 MG tablet Take 40 mg by mouth daily.      . minocycline (DYNACIN) 100 MG tablet Take 100 mg by mouth daily.      . pantoprazole (PROTONIX) 40  MG tablet Take 40 mg by mouth daily.      . traZODone (DESYREL) 100 MG tablet Take 100 mg by mouth daily.       No current facility-administered medications on file prior to visit.    ALLERGIES: No Known Allergies  FAMILY HISTORY: History reviewed. No pertinent family history.  SOCIAL HISTORY: Social History   Socioeconomic History  . Marital status: Married    Spouse name:  Not on file  . Number of children: Not on file  . Years of education: Not on file  . Highest education level: Not on file  Occupational History  . Not on file  Tobacco Use  . Smoking status: Never Smoker  . Smokeless tobacco: Never Used  . Tobacco comment: tobacco use - no  Substance and Sexual Activity  . Alcohol use: Not on file  . Drug use: Not on file  . Sexual activity: Not on file  Other Topics Concern  . Not on file  Social History Narrative   Married.    Social Determinants of Health   Financial Resource Strain:   . Difficulty of Paying Living Expenses: Not on file  Food Insecurity:   . Worried About Programme researcher, broadcasting/film/video in the Last Year: Not on file  . Ran Out of Food in the Last Year: Not on file  Transportation Needs:   . Lack of Transportation (Medical): Not on file  . Lack of Transportation (Non-Medical): Not on file  Physical Activity:   . Days of Exercise per Week: Not on file  . Minutes of Exercise per Session: Not on file  Stress:   . Feeling of Stress : Not on file  Social Connections:   . Frequency of Communication with Friends and Family: Not on file  . Frequency of Social Gatherings with Friends and Family: Not on file  . Attends Religious Services: Not on file  . Active Member of Clubs or Organizations: Not on file  . Attends Banker Meetings: Not on file  . Marital Status: Not on file  Intimate Partner Violence:   . Fear of Current or Ex-Partner: Not on file  . Emotionally Abused: Not on file  . Physically Abused: Not on file  . Sexually Abused: Not on file    Objective:  Blood pressure (!) 174/90, pulse 65, height 5' (1.524 m), weight 187 lb 3.2 oz (84.9 kg), SpO2 96 %. General: No acute distress.  Patient appears well-groomed.   Head:  Normocephalic/atraumatic Eyes:  fundi examined but not visualized Neck: supple, no paraspinal tenderness, full range of motion Back: No paraspinal tenderness Heart: regular rate and  rhythm Lungs: Clear to auscultation bilaterally. Vascular: No carotid bruits. Neurological Exam: Mental status: alert and oriented to person, place, and time, recent and remote memory intact, fund of knowledge intact, attention and concentration intact, speech fluent and not dysarthric, language intact. Cranial nerves: CN I: not tested CN II: pupils equal, round and reactive to light, visual fields intact CN III, IV, VI:  full range of motion, no nystagmus, no ptosis CN V: facial sensation intact. CN VII: upper and lower face symmetric CN VIII: hearing intact CN IX, X: gag intact, uvula midline CN XI: sternocleidomastoid and trapezius muscles intact CN XII: tongue midline Bulk & Tone: normal, no fasciculations. Motor:  muscle strength 5/5 throughout Sensation:  Pinprick, temperature and vibratory sensation intact. Deep Tendon Reflexes:  2+ throughout,  toes downgoing.   Finger to nose testing:  Without dysmetria.  Heel to shin:  Without dysmetria.   Gait:  Normal station and stride.  Romberg negative.  Assessment/Plan:   Chronic migraine without aura, without status migrainosus, not intractable  1.  I would like to start her either on a CGRP inhibitor or Botox.  She will contact her insurance to see how much each would cost and let me know. 2.  Stop sumatriptan.  Triptans contraindicated in people with CAD 3.  May use Excedrin or Tylenol but limit to no more than 2 days out of week to prevent rebound headache 4.  Keep headache diary 5.  Follow up 6 months.    Thank you for allowing me to take part in the care of this patient.  Shon Millet, DO  CC: Donzetta Sprung, MD

## 2020-07-04 ENCOUNTER — Encounter: Payer: Self-pay | Admitting: Neurology

## 2020-07-04 ENCOUNTER — Ambulatory Visit (INDEPENDENT_AMBULATORY_CARE_PROVIDER_SITE_OTHER): Payer: Medicare Other | Admitting: Neurology

## 2020-07-04 ENCOUNTER — Telehealth: Payer: Self-pay | Admitting: Neurology

## 2020-07-04 ENCOUNTER — Other Ambulatory Visit: Payer: Self-pay | Admitting: Neurology

## 2020-07-04 ENCOUNTER — Other Ambulatory Visit: Payer: Self-pay

## 2020-07-04 VITALS — BP 174/90 | HR 65 | Ht 60.0 in | Wt 187.2 lb

## 2020-07-04 DIAGNOSIS — G43709 Chronic migraine without aura, not intractable, without status migrainosus: Secondary | ICD-10-CM

## 2020-07-04 MED ORDER — AIMOVIG 70 MG/ML ~~LOC~~ SOAJ: 70.0000 mg | SUBCUTANEOUS | 5 refills | Status: AC

## 2020-07-04 NOTE — Progress Notes (Addendum)
Received fax PA form for patient's [employer]ovig and it was approved per phone call with [employer]. Will fax over info with approval dates.   Approval valid for 04/05/20 to 10/02/20.

## 2020-07-04 NOTE — Patient Instructions (Addendum)
  1. Contact your insurance to find out the cost of these medications if they were approved:  -  Aimovig  -  Emgality  -  Ajovy  -  Botox for chronic migraine 2. Stop sumatriptan/Imitrex.  It is contraindicated in people with heart disease. 3. My use Excedrin or Tylenol but limit use of pain relievers to no more than 2 days out of the week.  These medications include acetaminophen, NSAIDs (ibuprofen/Advil/Motrin, naproxen/Aleve, triptans (Imitrex/sumatriptan), Excedrin, and narcotics.  This will help reduce risk of rebound headaches. 4. Be aware of common food triggers:  - Caffeine:  coffee, black tea, cola, Mt. Dew  - Chocolate  - Dairy:  aged cheeses (brie, blue, cheddar, gouda, Applegate, provolone, Magnolia, Swiss, etc), chocolate milk, buttermilk, sour cream, limit eggs and yogurt  - Nuts, peanut butter  - Alcohol  - Cereals/grains:  FRESH breads (fresh bagels, sourdough, doughnuts), yeast productions  - Processed/canned/aged/cured meats (pre-packaged deli meats, hotdogs)  - MSG/glutamate:  soy sauce, flavor enhancer, pickled/preserved/marinated foods  - Sweeteners:  aspartame (Equal, Nutrasweet).  Sugar and Splenda are okay  - Vegetables:  legumes (lima beans, lentils, snow peas, fava beans, pinto peans, peas, garbanzo beans), sauerkraut, onions, olives, pickles  - Fruit:  avocados, bananas, citrus fruit (orange, lemon, grapefruit), mango  - Other:  Frozen meals, macaroni and cheese 5. Routine exercise 6. Stay adequately hydrated (aim for 64 oz water daily) 7. Keep headache diary 8. Maintain proper stress management 9. Maintain proper sleep hygiene 10. Do not skip meals 11. Consider supplements:  magnesium citrate 400mg  daily, riboflavin 400mg  daily, coenzyme Q10 100mg  three times daily.

## 2020-07-04 NOTE — Telephone Encounter (Signed)
Patient has been approved for the Aimovig 70mg  through her insurance. I see office visit note was that she was supposed to contact insurance and see what was covered. I don't see script on file for the Aimovig. Just wanted to let you know about approval incase you need to send script to pharmacy. Thanks!

## 2020-07-04 NOTE — Telephone Encounter (Signed)
Please send script

## 2020-07-04 NOTE — Telephone Encounter (Signed)
Patient called in and left a message stating she saw she was approved for her Aimovig through her insurance. She wants to make sure a prescription is sent into [facility] so she can go pick it up

## 2020-07-04 NOTE — Telephone Encounter (Signed)
Prescription for Aimovig 70mg  every 28 days sent to [facility]

## 2020-12-15 ENCOUNTER — Other Ambulatory Visit: Payer: Self-pay | Admitting: Neurology

## 2021-03-26 NOTE — Progress Notes (Signed)
NEUROLOGY FOLLOW UP OFFICE NOTE  [name] [id number]  Assessment/Plan:   Migraine without aura, without status migrainosus, not intractable  Migraine prevention:  Aimovig 70mg  Q28d Migraine rescue:  Tylenol Limit use of pain relievers to no more than 2 days out of week to prevent risk of rebound or medication-overuse headache. Keep headache diary Follow up one year   Subjective:  [name] is a 70 year old right-handed female with CAD, HTN, dyslipidemia, GERD, osteoarthritis who follows up for headache.  UPDATE: Last seen in November.  Started Aimovig 70mg  every 28 days.  She had a NSTEMI in June requiring a stent.  Headaches are still well-controlled. Intensity:  moderate Duration:  a couple of hours with Tylenol Frequency:  2 a month Rescue protocol:  Excedrin, later may take Tylenol if returns. Current NSAIDS/analgesics:  Tylenol, ASA 81mg  daily Current triptans:  none Current ergotamine:  none Current anti-emetic:  none Current muscle relaxants:  none Current Antihypertensive medications:  Toprol-XL Current Antidepressant medications:  Trazodone (for sleep) Current Anticonvulsant medications:  none Current anti-CGRP:  none Current Vitamins/Herbal/Supplements:  riboflavin CoQ10, magnesium oxide Current Antihistamines/Decongestants:  none Other therapy:  none Hormone/birth control:  none  Caffeine:  No coffee.  Drinks tea.  Soda twice a week. Diet:  48 oz of water daily.  Does not skip meals. Exercise:  yes Depression:  no; Anxiety:  no Other pain:  Ankle sometimes hurts Sleep hygiene:  Well with trazodone  HISTORY:  Headaches since her early 48s.  Worse since her mid-50s.  They are severe but moderate with Excedrin.  It is a throbbing mid-frontal pain.  Photophobia and phonophobia.  No nausea, vomiting, visual disturbance, speech disturbance, numbness or weakness.  Takes Excedrin and resolved within 10 minutes.  Three times a week headache  returns later in day for which she takes Tylenol.  Headaches are daily over past couple of years.  No known triggers.  Beside Excedrin or Tylenol, nothing else helps relieve it.       Past NSAIDS/analgesics:  Ibuprofen, naproxen, Excedrin Past abortive triptans:  sumatriptan Past abortive ergotamine:  none Past muscle relaxants: none Past anti-emetic:  none Past antihypertensive medications:  atenolol Past antidepressant medications: none Past anticonvulsant medications:  topiramate Past anti-CGRP:  Emgality (only initial dose but never got a prescription) Past vitamins/Herbal/Supplements:  none Past antihistamines/decongestants:  none Other past therapies:  none    Family history of headache:  Not as far as she knows  PAST MEDICAL HISTORY: Past Medical History:  Diagnosis Date   CAD (coronary artery disease)    single vessel; s/p stenting, nml LVF, nml adenosine cardiolite; EF 60% 12/08   Chronic headache    Dyslipidemia    HTN (hypertension)    boarderline   Sinus bradycardia    asymptomatic    MEDICATIONS: Current Outpatient Medications on File Prior to Visit  Medication Sig Dispense Refill   aspirin 81 MG EC tablet Take 81 mg by mouth daily.       atenolol (TENORMIN) 50 MG tablet Take 75 mg by mouth daily.      atorvastatin (LIPITOR) 40 MG tablet Take 40 mg by mouth daily.       doxycycline (MONODOX) 100 MG capsule Take 100 mg by mouth daily.     Erenumab-aooe (AIMOVIG) 70 MG/ML SOAJ Inject 70 mg into the skin every 28 (twenty-eight) days. 1.12 mL 5   minocycline (DYNACIN) 100 MG tablet Take 100 mg by mouth daily.   (Patient not  taking: Reported on 07/04/2020)     pantoprazole (PROTONIX) 40 MG tablet Take 40 mg by mouth daily.       traZODone (DESYREL) 100 MG tablet Take 100 mg by mouth daily.       No current facility-administered medications on file prior to visit.    ALLERGIES: No Known Allergies  FAMILY HISTORY: No family history on file.    Objective:   Blood pressure (!) 153/82, pulse 61, height 5' (1.524 m), weight 179 lb (81.2 kg), SpO2 99 %. General: No acute distress.  Patient appears well-groomed.   Head:  Normocephalic/atraumatic Eyes:  Fundi examined but not visualized Neck: supple, no paraspinal tenderness, full range of motion Heart:  Regular rate and rhythm Lungs:  Clear to auscultation bilaterally Back: No paraspinal tenderness Neurological Exam: alert and oriented to person, place, and time.  Speech fluent and not dysarthric, language intact.  CN II-XII intact. Bulk and tone normal, muscle strength 5/5 throughout.  Sensation to light touch intact.  Deep tendon reflexes 2+ throughout, toes downgoing.  Finger to nose testing intact.  Gait normal, Romberg negative.   Shon Millet, DO  CC: Donzetta Sprung, MD

## 2021-03-27 ENCOUNTER — Ambulatory Visit (INDEPENDENT_AMBULATORY_CARE_PROVIDER_SITE_OTHER): Payer: Medicare Other | Admitting: Neurology

## 2021-03-27 ENCOUNTER — Encounter: Payer: Self-pay | Admitting: Neurology

## 2021-03-27 ENCOUNTER — Other Ambulatory Visit: Payer: Self-pay

## 2021-03-27 VITALS — BP 153/82 | HR 61 | Ht 60.0 in | Wt 179.0 lb

## 2021-03-27 DIAGNOSIS — G43009 Migraine without aura, not intractable, without status migrainosus: Secondary | ICD-10-CM | POA: Diagnosis not present

## 2021-03-27 NOTE — Patient Instructions (Signed)
Aimovig Tylenol

## 2021-04-12 DEATH — deceased

## 2021-05-02 ENCOUNTER — Ambulatory Visit: Payer: Medicare Other | Admitting: Neurology

## 2022-03-26 NOTE — Progress Notes (Deleted)
NEUROLOGY FOLLOW UP OFFICE NOTE  [name] [id number]  Assessment/Plan:   Migraine without aura, without status migrainosus, not intractable   Migraine prevention:  Aimovig 70mg  Q28d Migraine rescue:  Tylenol Limit use of pain relievers to no more than 2 days out of week to prevent risk of rebound or medication-overuse headache. Keep headache diary Follow up one year     Subjective:  [name] is a 71 year old right-handed female with CAD s/p stent, HTN, dyslipidemia, GERD, osteoarthritis who follows up for headache.   UPDATE: Headaches are still well-controlled. Intensity:  moderate Duration:  a couple of hours with Tylenol Frequency:  2 a month Rescue protocol:  Excedrin, later may take Tylenol if returns. Current NSAIDS/analgesics:  Tylenol, ASA 81mg  daily Current triptans:  none Current ergotamine:  none Current anti-emetic:  none Current muscle relaxants:  none Current Antihypertensive medications:  Toprol-XL Current Antidepressant medications:  Trazodone (for sleep) Current Anticonvulsant medications:  none Current anti-CGRP:  none Current Vitamins/Herbal/Supplements:  riboflavin CoQ10, magnesium oxide Current Antihistamines/Decongestants:  none Other therapy:  none Hormone/birth control:  none   Caffeine:  No coffee.  Drinks tea.  Soda twice a week. Diet:  48 oz of water daily.  Does not skip meals. Exercise:  yes Depression:  no; Anxiety:  no Other pain:  Ankle sometimes hurts Sleep hygiene:  Well with trazodone   HISTORY:  Headaches since her early 36s.  Worse since her mid-50s.  They are severe but moderate with Excedrin.  It is a throbbing mid-frontal pain.  Photophobia and phonophobia.  No nausea, vomiting, visual disturbance, speech disturbance, numbness or weakness.  Takes Excedrin and resolved within 10 minutes.  Three times a week headache returns later in day for which she takes Tylenol.  Headaches are daily over past couple of  years.  No known triggers.  Beside Excedrin or Tylenol, nothing else helps relieve it.       Past NSAIDS/analgesics:  Ibuprofen, naproxen, Excedrin Past abortive triptans:  sumatriptan Past abortive ergotamine:  none Past muscle relaxants: none Past anti-emetic:  none Past antihypertensive medications:  atenolol Past antidepressant medications: none Past anticonvulsant medications:  topiramate Past anti-CGRP:  Emgality (only initial dose but never got a prescription) Past vitamins/Herbal/Supplements:  none Past antihistamines/decongestants:  none Other past therapies:  none     Family history of headache:  Not as far as she knows  PAST MEDICAL HISTORY: Past Medical History:  Diagnosis Date   CAD (coronary artery disease)    single vessel; s/p stenting, nml LVF, nml adenosine cardiolite; EF 60% 12/08   Chronic headache    Dyslipidemia    HTN (hypertension)    boarderline   Sinus bradycardia    asymptomatic    MEDICATIONS: Current Outpatient Medications on File Prior to Visit  Medication Sig Dispense Refill   aspirin 81 MG EC tablet Take 81 mg by mouth daily.       atorvastatin (LIPITOR) 80 MG tablet Take 80 mg by mouth daily.     BRILINTA 90 MG TABS tablet Take 90 mg by mouth 2 (two) times daily.     Cholecalciferol (VITAMIN D-3) 125 MCG (5000 UT) TABS Take by mouth.     Coenzyme Q10 100 MG TABS Take 100 mg by mouth 3 (three) times daily.     doxycycline (VIBRA-TABS) 100 MG tablet TAKE 1 TABLET BY MOUTH DAILY FOR ROSACEA     Erenumab-aooe (AIMOVIG) 70 MG/ML SOAJ Inject 70 mg into the skin every 28 (  twenty-eight) days. 1.12 mL 5   magnesium oxide (MAG-OX) 400 MG tablet Take by mouth.     metoprolol succinate (TOPROL-XL) 50 MG 24 hr tablet Take 50 mg by mouth every morning.     pantoprazole (PROTONIX) 40 MG tablet Take 40 mg by mouth daily.       riboflavin (VITAMIN B-2) 100 MG TABS tablet Take 100 mg by mouth daily.     Riboflavin 400 MG CAPS Take by mouth.     traZODone  (DESYREL) 100 MG tablet Take 100 mg by mouth daily.       No current facility-administered medications on file prior to visit.    ALLERGIES: No Known Allergies  FAMILY HISTORY: No family history on file.    Objective:  *** General: No acute distress.  Patient appears well-groomed.   Head:  Normocephalic/atraumatic Eyes:  Fundi examined but not visualized Neck: supple, no paraspinal tenderness, full range of motion Heart:  Regular rate and rhythm Lungs:  Clear to auscultation bilaterally Back: No paraspinal tenderness Neurological Exam: alert and oriented to person, place, and time.  Speech fluent and not dysarthric, language intact.  CN II-XII intact. Bulk and tone normal, muscle strength 5/5 throughout.  Sensation to light touch intact.  Deep tendon reflexes 2+ throughout, toes downgoing.  Finger to nose testing intact.  Gait normal, Romberg negative.   Shon Millet, DO  CC: Tara Sprung, MD

## 2022-03-27 ENCOUNTER — Ambulatory Visit: Payer: Medicare Other | Admitting: Neurology

## 2022-03-27 ENCOUNTER — Encounter: Payer: Self-pay | Admitting: Neurology

## 2022-03-27 DIAGNOSIS — Z029 Encounter for administrative examinations, unspecified: Secondary | ICD-10-CM
